# Patient Record
Sex: Male | Born: 1950 | Race: White | Hispanic: No | Marital: Married | Smoking: Never smoker
Health system: Southern US, Community
[De-identification: ages and names within clinical notes are randomized; demographics above are authoritative.]

## PROBLEM LIST (undated history)

## (undated) DIAGNOSIS — S42001A Fracture of unspecified part of right clavicle, initial encounter for closed fracture: Secondary | ICD-10-CM

## (undated) DIAGNOSIS — Z9861 Coronary angioplasty status: Secondary | ICD-10-CM

## (undated) DIAGNOSIS — R7303 Prediabetes: Secondary | ICD-10-CM

## (undated) DIAGNOSIS — E785 Hyperlipidemia, unspecified: Secondary | ICD-10-CM

## (undated) DIAGNOSIS — K219 Gastro-esophageal reflux disease without esophagitis: Secondary | ICD-10-CM

## (undated) DIAGNOSIS — M1990 Unspecified osteoarthritis, unspecified site: Secondary | ICD-10-CM

## (undated) DIAGNOSIS — R569 Unspecified convulsions: Secondary | ICD-10-CM

## (undated) DIAGNOSIS — Z8679 Personal history of other diseases of the circulatory system: Secondary | ICD-10-CM

## (undated) DIAGNOSIS — E119 Type 2 diabetes mellitus without complications: Secondary | ICD-10-CM

## (undated) DIAGNOSIS — I10 Essential (primary) hypertension: Secondary | ICD-10-CM

## (undated) DIAGNOSIS — I2510 Atherosclerotic heart disease of native coronary artery without angina pectoris: Secondary | ICD-10-CM

## (undated) DIAGNOSIS — Z87898 Personal history of other specified conditions: Secondary | ICD-10-CM

## (undated) HISTORY — DX: Coronary angioplasty status: Z98.61

## (undated) HISTORY — DX: Prediabetes: R73.03

## (undated) HISTORY — DX: Personal history of other diseases of the circulatory system: Z86.79

## (undated) HISTORY — DX: Hyperlipidemia, unspecified: E78.5

## (undated) HISTORY — DX: Essential (primary) hypertension: I10

## (undated) HISTORY — PX: COLONOSCOPY W/ BIOPSIES AND POLYPECTOMY: SHX1376

## (undated) HISTORY — DX: Personal history of other specified conditions: Z87.898

## (undated) HISTORY — DX: Atherosclerotic heart disease of native coronary artery without angina pectoris: I25.10

---

## 2003-04-18 ENCOUNTER — Other Ambulatory Visit: Payer: Self-pay

## 2003-05-03 DIAGNOSIS — I2510 Atherosclerotic heart disease of native coronary artery without angina pectoris: Secondary | ICD-10-CM

## 2003-05-03 DIAGNOSIS — Z9861 Coronary angioplasty status: Secondary | ICD-10-CM

## 2003-05-03 HISTORY — DX: Atherosclerotic heart disease of native coronary artery without angina pectoris: I25.10

## 2003-05-03 HISTORY — DX: Coronary angioplasty status: Z98.61

## 2003-05-03 HISTORY — PX: HERNIA REPAIR: SHX51

## 2003-05-03 HISTORY — PX: CARDIAC CATHETERIZATION: SHX172

## 2003-11-14 ENCOUNTER — Observation Stay (HOSPITAL_COMMUNITY): Admission: EM | Admit: 2003-11-14 | Discharge: 2003-11-14 | Payer: Self-pay | Admitting: Podiatry

## 2004-12-07 ENCOUNTER — Other Ambulatory Visit: Payer: Self-pay

## 2004-12-07 ENCOUNTER — Emergency Department: Payer: Self-pay | Admitting: Emergency Medicine

## 2008-08-06 ENCOUNTER — Emergency Department: Payer: Self-pay | Admitting: Emergency Medicine

## 2009-07-13 ENCOUNTER — Emergency Department: Payer: Self-pay | Admitting: Emergency Medicine

## 2010-02-12 ENCOUNTER — Emergency Department (HOSPITAL_COMMUNITY): Admission: EM | Admit: 2010-02-12 | Discharge: 2010-02-12 | Payer: Self-pay | Admitting: Emergency Medicine

## 2010-02-24 ENCOUNTER — Encounter: Admission: RE | Admit: 2010-02-24 | Discharge: 2010-02-24 | Payer: Self-pay | Admitting: Gastroenterology

## 2010-06-28 ENCOUNTER — Emergency Department (HOSPITAL_COMMUNITY): Payer: BC Managed Care – PPO

## 2010-06-28 ENCOUNTER — Observation Stay (HOSPITAL_COMMUNITY)
Admission: EM | Admit: 2010-06-28 | Discharge: 2010-06-30 | Disposition: A | Discharge status: Home / Self Care | Payer: BC Managed Care – PPO | Attending: Cardiovascular Disease | Admitting: Cardiovascular Disease

## 2010-06-28 DIAGNOSIS — I2510 Atherosclerotic heart disease of native coronary artery without angina pectoris: Principal | ICD-10-CM | POA: Insufficient documentation

## 2010-06-28 DIAGNOSIS — E785 Hyperlipidemia, unspecified: Secondary | ICD-10-CM | POA: Insufficient documentation

## 2010-06-28 DIAGNOSIS — I200 Unstable angina: Secondary | ICD-10-CM | POA: Insufficient documentation

## 2010-06-28 DIAGNOSIS — R0989 Other specified symptoms and signs involving the circulatory and respiratory systems: Secondary | ICD-10-CM | POA: Insufficient documentation

## 2010-06-28 DIAGNOSIS — R0609 Other forms of dyspnea: Secondary | ICD-10-CM | POA: Insufficient documentation

## 2010-06-28 DIAGNOSIS — Z01812 Encounter for preprocedural laboratory examination: Secondary | ICD-10-CM | POA: Insufficient documentation

## 2010-06-28 LAB — COMPREHENSIVE METABOLIC PANEL
AST: 27 U/L (ref 0–37)
Albumin: 4 g/dL (ref 3.5–5.2)
Alkaline Phosphatase: 75 U/L (ref 39–117)
BUN: 16 mg/dL (ref 6–23)
CO2: 25 mEq/L (ref 19–32)
Calcium: 9.3 mg/dL (ref 8.4–10.5)
Chloride: 107 mEq/L (ref 96–112)
Creatinine, Ser: 1.21 mg/dL (ref 0.4–1.5)
GFR calc Af Amer: >60 mL/min (ref >60)
Glucose, Bld: 180 mg/dL — ABNORMAL HIGH (ref 70–99)
Potassium: 4.1 mEq/L (ref 3.5–5.1)
Sodium: 139 mEq/L (ref 135–145)
Total Bilirubin: 0.7 mg/dL (ref 0.3–1.2)
Total Protein: 6.8 g/dL (ref 6.0–8.3)

## 2010-06-28 LAB — CBC
HCT: 41.5 % (ref 39.0–52.0)
MCH: 28.4 pg (ref 26.0–34.0)
MCHC: 34.2 g/dL (ref 30.0–36.0)
Platelets: 229 10*3/uL (ref 150–400)
RBC: 5 MIL/uL (ref 4.22–5.81)
RDW: 12.2 % (ref 11.5–15.5)
WBC: 5.6 10*3/uL (ref 4.0–10.5)

## 2010-06-28 LAB — POCT CARDIAC MARKERS
CKMB, poc: <1 ng/mL — ABNORMAL LOW (ref 1.0–8.0)
CKMB, poc: 1.3 ng/mL (ref 1.0–8.0)
Myoglobin, poc: 53.9 ng/mL (ref 12–200)
Troponin i, poc: <0.05 ng/mL (ref 0.00–0.09)
Troponin i, poc: <0.05 ng/mL (ref 0.00–0.09)

## 2010-06-28 LAB — DIFFERENTIAL
Basophils Absolute: 0 10*3/uL (ref 0.0–0.1)
Basophils Relative: 1 % (ref 0–1)
Eosinophils Absolute: 0.2 10*3/uL (ref 0.0–0.7)
Eosinophils Relative: 3 % (ref 0–5)
Lymphocytes Relative: 30 % (ref 12–46)
Lymphs Abs: 1.7 10*3/uL (ref 0.7–4.0)
Monocytes Absolute: 0.5 10*3/uL (ref 0.1–1.0)
Neutro Abs: 3.2 10*3/uL (ref 1.7–7.7)
Neutrophils Relative %: 57 % (ref 43–77)

## 2010-06-29 ENCOUNTER — Observation Stay (HOSPITAL_COMMUNITY): Payer: BC Managed Care – PPO

## 2010-06-29 HISTORY — PX: CARDIAC CATHETERIZATION: SHX172

## 2010-06-29 LAB — CARDIAC PANEL(CRET KIN+CKTOT+MB+TROPI)
CK, MB: 1.2 ng/mL (ref 0.3–4.0)
CK, MB: 1 ng/mL (ref 0.3–4.0)
Relative Index: INVALID (ref 0.0–2.5)
Relative Index: INVALID (ref 0.0–2.5)
Total CK: 77 U/L (ref 7–232)
Total CK: 66 U/L (ref 7–232)
Troponin I: <0.01 ng/mL (ref 0.00–0.06)
Troponin I: <0.01 ng/mL (ref 0.00–0.06)

## 2010-06-29 LAB — CBC
HCT: 39.4 % (ref 39.0–52.0)
Hemoglobin: 13.3 g/dL (ref 13.0–17.0)
MCHC: 33.8 g/dL (ref 30.0–36.0)
MCV: 82.1 fL (ref 78.0–100.0)
RBC: 4.8 MIL/uL (ref 4.22–5.81)
RDW: 12.2 % (ref 11.5–15.5)
WBC: 4.9 10*3/uL (ref 4.0–10.5)

## 2010-06-29 LAB — LIPID PANEL
Cholesterol: 132 mg/dL (ref 0–200)
HDL: 36 mg/dL — ABNORMAL LOW (ref >39)
LDL Cholesterol: 83 mg/dL (ref 0–99)
Total CHOL/HDL Ratio: 3.7 RATIO
VLDL: 13 mg/dL (ref 0–40)

## 2010-06-29 LAB — TSH: TSH: 1.943 u[IU]/mL (ref 0.350–4.500)

## 2010-06-29 LAB — APTT: aPTT: 28 seconds (ref 24–37)

## 2010-06-29 LAB — CK TOTAL AND CKMB (NOT AT ARMC)
CK, MB: 1.7 ng/mL (ref 0.3–4.0)
Total CK: 106 U/L (ref 7–232)

## 2010-06-29 LAB — HEPARIN LEVEL (UNFRACTIONATED): Heparin Unfractionated: 0.32 IU/mL (ref 0.30–0.70)

## 2010-06-29 LAB — PROTIME-INR
INR: 0.99 (ref 0.00–1.49)
Prothrombin Time: 13.3 seconds (ref 11.6–15.2)

## 2010-06-29 LAB — TROPONIN I: Troponin I: 0.01 ng/mL (ref 0.00–0.06)

## 2010-06-29 LAB — HEMOGLOBIN A1C: Mean Plasma Glucose: 148 mg/dL — ABNORMAL HIGH (ref <117)

## 2010-06-30 LAB — BASIC METABOLIC PANEL
Calcium: 8.8 mg/dL (ref 8.4–10.5)
Chloride: 106 mEq/L (ref 96–112)
Creatinine, Ser: 1.11 mg/dL (ref 0.4–1.5)
GFR calc Af Amer: >60 mL/min (ref >60)
GFR calc non Af Amer: >60 mL/min (ref >60)
Glucose, Bld: 127 mg/dL — ABNORMAL HIGH (ref 70–99)
Potassium: 4.1 mEq/L (ref 3.5–5.1)
Sodium: 138 mEq/L (ref 135–145)

## 2010-06-30 LAB — CBC
HCT: 38.3 % — ABNORMAL LOW (ref 39.0–52.0)
Hemoglobin: 12.8 g/dL — ABNORMAL LOW (ref 13.0–17.0)
MCH: 27.5 pg (ref 26.0–34.0)
MCHC: 33.4 g/dL (ref 30.0–36.0)
Platelets: 200 10*3/uL (ref 150–400)
RBC: 4.65 MIL/uL (ref 4.22–5.81)
RDW: 12.3 % (ref 11.5–15.5)
WBC: 6.9 10*3/uL (ref 4.0–10.5)

## 2010-06-30 LAB — POCT ACTIVATED CLOTTING TIME: Activated Clotting Time: 417 seconds

## 2010-07-01 ENCOUNTER — Emergency Department (HOSPITAL_COMMUNITY): Payer: BC Managed Care – PPO

## 2010-07-01 ENCOUNTER — Observation Stay (HOSPITAL_COMMUNITY)
Admission: EM | Admit: 2010-07-01 | Discharge: 2010-07-02 | DRG: 183 | Disposition: A | Discharge status: Home / Self Care | Payer: BC Managed Care – PPO | Attending: Cardiovascular Disease | Admitting: Cardiovascular Disease

## 2010-07-01 DIAGNOSIS — E119 Type 2 diabetes mellitus without complications: Secondary | ICD-10-CM | POA: Insufficient documentation

## 2010-07-01 DIAGNOSIS — Z9861 Coronary angioplasty status: Secondary | ICD-10-CM | POA: Insufficient documentation

## 2010-07-01 DIAGNOSIS — Z01810 Encounter for preprocedural cardiovascular examination: Secondary | ICD-10-CM | POA: Insufficient documentation

## 2010-07-01 DIAGNOSIS — R0789 Other chest pain: Secondary | ICD-10-CM | POA: Insufficient documentation

## 2010-07-01 DIAGNOSIS — Z01818 Encounter for other preprocedural examination: Secondary | ICD-10-CM | POA: Insufficient documentation

## 2010-07-01 DIAGNOSIS — Z8249 Family history of ischemic heart disease and other diseases of the circulatory system: Secondary | ICD-10-CM | POA: Insufficient documentation

## 2010-07-01 DIAGNOSIS — Z01812 Encounter for preprocedural laboratory examination: Secondary | ICD-10-CM | POA: Insufficient documentation

## 2010-07-01 DIAGNOSIS — K219 Gastro-esophageal reflux disease without esophagitis: Principal | ICD-10-CM | POA: Insufficient documentation

## 2010-07-01 DIAGNOSIS — I2510 Atherosclerotic heart disease of native coronary artery without angina pectoris: Secondary | ICD-10-CM | POA: Insufficient documentation

## 2010-07-01 LAB — POCT I-STAT, CHEM 8
Creatinine, Ser: 1.1 mg/dL (ref 0.4–1.5)
HCT: 45 % (ref 39.0–52.0)
Hemoglobin: 15.3 g/dL (ref 13.0–17.0)
Potassium: 4.6 mEq/L (ref 3.5–5.1)
Sodium: 139 mEq/L (ref 135–145)

## 2010-07-01 LAB — COMPREHENSIVE METABOLIC PANEL
BUN: 16 mg/dL (ref 6–23)
Calcium: 9.6 mg/dL (ref 8.4–10.5)
GFR calc non Af Amer: >60 mL/min (ref >60)
Glucose, Bld: 120 mg/dL — ABNORMAL HIGH (ref 70–99)
Total Protein: 7.9 g/dL (ref 6.0–8.3)

## 2010-07-01 LAB — POCT CARDIAC MARKERS
CKMB, poc: <1 ng/mL — ABNORMAL LOW (ref 1.0–8.0)
Myoglobin, poc: 67.5 ng/mL (ref 12–200)

## 2010-07-01 LAB — CBC
HCT: 42.7 % (ref 39.0–52.0)
Hemoglobin: 14.7 g/dL (ref 13.0–17.0)
RBC: 5.23 MIL/uL (ref 4.22–5.81)
WBC: 8.2 10*3/uL (ref 4.0–10.5)

## 2010-07-01 LAB — CK TOTAL AND CKMB (NOT AT ARMC)
Relative Index: INVALID (ref 0.0–2.5)
Total CK: 83 U/L (ref 7–232)

## 2010-07-01 LAB — APTT: aPTT: 27 seconds (ref 24–37)

## 2010-07-01 LAB — DIFFERENTIAL
Basophils Absolute: 0 10*3/uL (ref 0.0–0.1)
Lymphocytes Relative: 18 % (ref 12–46)
Monocytes Absolute: 1 10*3/uL (ref 0.1–1.0)
Neutro Abs: 5.5 10*3/uL (ref 1.7–7.7)

## 2010-07-01 LAB — URINALYSIS, ROUTINE W REFLEX MICROSCOPIC
Bilirubin Urine: NEGATIVE
Hgb urine dipstick: NEGATIVE
Protein, ur: NEGATIVE mg/dL
Specific Gravity, Urine: 1.021 (ref 1.005–1.030)
Urine Glucose, Fasting: NEGATIVE mg/dL
Urobilinogen, UA: 0.2 mg/dL (ref 0.0–1.0)

## 2010-07-01 LAB — AMYLASE: Amylase: 41 U/L (ref 0–105)

## 2010-07-01 LAB — TROPONIN I: Troponin I: 0.01 ng/mL (ref 0.00–0.06)

## 2010-07-01 LAB — PROTIME-INR: INR: 0.93 (ref 0.00–1.49)

## 2010-07-01 NOTE — Procedures (Signed)
[name], [name]              ACCOUNT NO.:  [id number]  MEDICAL RECORD NO.:  [id number]           PATIENT TYPE:  I  LOCATION:  6529                         FACILITY:  [facility]  PHYSICIAN:  Thereasa Solo. Little, M.D. DATE OF BIRTH:  15-Jul-1950  DATE OF PROCEDURE:  06/29/2010 DATE OF DISCHARGE:                           CARDIAC CATHETERIZATION   INDICATIONS FOR TEST:  This 60 year old male has had exertional anginal symptoms for several weeks and has had indigestion/heartburn for months. He is also noticed increasing dyspnea on exertion, presented last night with worsening anginal complaints.  His cardiac markers are negative.  After obtaining informed consent, the patient was prepped and draped in the usual sterile fashion exposing the right groin following local anesthetic with 1% Xylocaine.  The Seldinger technique was employed and a 5-French introducer sheath was placed in the right femoral artery. Left and right coronary arteriography and ventriculography in the RAO projection was performed.  Following this, PCI to the LAD was performed.  COMPLICATIONS:  None.  TOTAL CONTRAST USED:  210 mL.  PROCEDURES: 1. Left heart catheterization. 2. Ventriculography RAO projection. 3. PCI to mid LAD (see lesion).  RESULTS: 1. Hemodynamic monitoring.  Central aortic pressure was 106/66.  Left     ventricular pressure was 106/4.  There was no valve gradient noted     at the time of pullback. 2. Ventriculography.  Ventriculography in the RAO projection revealed     normal LV systolic function.  The ejection fraction was in excess     of 60%.  The end-diastolic pressure was 10. 3. Coronary arteriography.     a.     Left main was dilated at about 5 mm. 4. Circumflex.  The circumflex was appropriate size with 30%     irregularities in its midportion. 5. Ramus.  This was a smaller vessel about 1-1/2 mm free of disease. 6. LAD.  The proximal-third of the LAD was saccular and dilated just    distal to the diagonal and just distal to the saccular area in the     LAD with a concentric 95% area of narrowing.  It extended from the     end of the saccular area about 8 mm.  The more distal and the     distal mid LAD were free of disease and the diagonal which was a     large vessel that came off the saccular area was free of disease     and bifurcated. 7. Right coronary artery.  The right coronary artery was a large     slightly dilated 6-mm vessel with minimal proximal and mid     irregularities.  The PDA and posterior lateral vessels x2 were free     of disease.  Because of the high-grade lesion in the LAD, decision was made to intervene, it was not a discrete landing target in the proximal portion of the lesion and the stent had to extend right up into the dilated saccular area.  An XBLAD guide catheter, short Luge wire was used.  The Luge wire was placed down the LAD.  Predilatation was accomplished with a 2.5  x 12 apex balloon, two inflations, 7 x 30 and 14 x 30.  Stenting was accomplished with a Prmous (drug-eluting) 3.0 x 12 stent, 14 atmospheres x34 and 15 atmospheres x38 seconds.  Postdilatation was accomplished with a 3.25 x 8 TREK balloon 14 atmospheres for 35 seconds.  The area that had been 90% narrowed, pre-intervention now appeared to be normal.  There was no evidence of any dissection or distal embolization or thrombus formation.  The diagonal was not jailed by the stent and the stent did not extend a millimeter into the saccular area.  The patient should be ready for discharge to home, Angiomax was used with an ACT of 417.  There was given 60 mg of oral Effient.  He will be discharged on Effient and aspirin and the Effient will need to be continued for a minimum of 12 months.          ______________________________ Thereasa Solo. Little, M.D.     ABL/MEDQ  D:  06/29/2010  T:  06/30/2010  Job:  [id number]  cc:   Dr. Tresa Endo  Electronically Signed by  Julieanne Manson M.D. on 07/01/2010 07:58:07 AM

## 2010-07-02 LAB — CBC
MCH: 28.1 pg (ref 26.0–34.0)
MCHC: 33.9 g/dL (ref 30.0–36.0)
MCV: 82.7 fL (ref 78.0–100.0)
Platelets: 197 10*3/uL (ref 150–400)
RDW: 12.3 % (ref 11.5–15.5)
WBC: 5.2 10*3/uL (ref 4.0–10.5)

## 2010-07-02 LAB — GLUCOSE, CAPILLARY
Glucose-Capillary: 104 mg/dL — ABNORMAL HIGH (ref 70–99)
Glucose-Capillary: 108 mg/dL — ABNORMAL HIGH (ref 70–99)

## 2010-07-02 LAB — CARDIAC PANEL(CRET KIN+CKTOT+MB+TROPI)
CK, MB: 0.9 ng/mL (ref 0.3–4.0)
CK, MB: 0.9 ng/mL (ref 0.3–4.0)
Relative Index: INVALID (ref 0.0–2.5)
Relative Index: INVALID (ref 0.0–2.5)
Total CK: 52 U/L (ref 7–232)
Troponin I: 0.01 ng/mL (ref 0.00–0.06)

## 2010-07-02 LAB — BASIC METABOLIC PANEL
BUN: 13 mg/dL (ref 6–23)
CO2: 23 mEq/L (ref 19–32)
Calcium: 9 mg/dL (ref 8.4–10.5)
Chloride: 106 mEq/L (ref 96–112)
Creatinine, Ser: 1.02 mg/dL (ref 0.4–1.5)
GFR calc Af Amer: >60 mL/min (ref >60)
Glucose, Bld: 126 mg/dL — ABNORMAL HIGH (ref 70–99)

## 2010-07-02 LAB — HEPARIN LEVEL (UNFRACTIONATED): Heparin Unfractionated: 0.37 IU/mL (ref 0.30–0.70)

## 2010-07-02 NOTE — Discharge Summary (Signed)
[name], [name]              ACCOUNT NO.:  [id number]  MEDICAL RECORD NO.:  [id number]           PATIENT TYPE:  I  LOCATION:  6529                         FACILITY:  [facility]  PHYSICIAN:  Thurmon Fair, MD     DATE OF BIRTH:  02/15/1951  DATE OF ADMISSION:  06/28/2010 DATE OF DISCHARGE:  06/30/2010                              DISCHARGE SUMMARY   DISCHARGE DIAGNOSES: 1. Unstable angina.  Negative myocardial infarction. 2. Coronary artery disease with 90% left anterior descending artery     stenosis, undergoing percutaneous transluminal coronary angioplasty     and stent deployment with a Promus drug-eluting stent to the left     anterior descending artery. 3. Normal left ventricular function. 4. Elevated hemoglobin A1c.     a.     Hyperglycemia with diabetic teaching during hospitalization. 5. Dyslipidemia, treated.  DISCHARGE CONDITION:  Improved.  PROCEDURES:  Combined left heart cath by Dr. Julieanne Manson on June 29, 2010.  On June 29, 2010, PTCA and stent deployment to the LAD with a drug- eluting Promus stent by Dr. Julieanne Manson.  HISTORY OF PRESENT ILLNESS:  A 60 year old male with a history of coronary artery disease with 50% circumflex and LAD aneurysm in 2005 and has been treated.  Over the last 5 months, he has been having increasing dyspnea on exertion and then 3 days prior to admission, had been having chest tightness and left arm pain with exercise and takes less exercise to cause the pain to come on.  He came to the emergency room on June 28, 2010.  Enzymes are negative.  EKG without change with episodic chest tightness and left arm and left neck pain.  No associated nausea, vomiting, or diaphoresis.  He was admitted to rule out MI and plans for cardiac catheterization with history of known coronary artery disease and concern that the LAD disease had increased, and he was placed on IV heparin, nitro paste.  PAST MEDICAL HISTORY:  Other than  coronary artery disease, has gastroesophageal reflux disease followed by Dr. Matthias Hughs, previous hernia repair, history of SVT.  DISCHARGE MEDICATIONS:  See medication reconciliation sheet from [facility]. We did add Effient and aspirin for his stent as well as ramipril for his hypertension.  We did not add any diabetic medicine at this time.  We have asked him to follow up with his primary care within the next week for further management.  DISCHARGE INSTRUCTIONS: 1. May return to work Monday, on July 05, 2010. 2. Increase activity slowly.  May shower.  No lifting for 1 week.  No     driving for 2 days.  No sexual activity for 2 days. 3. Low-sodium heart-healthy diabetic diet. 4. Wash cath site with soap and water.  Call if any bleeding, swelling     or drainage. 5. Follow up with Dr. Herbie Baltimore at [facility] on     July 12, 2010 at 11:30 a.m. 6. Follow up with [facility] in [place] within 1 week.  LABORATORY DATA:  Sodium 138, potassium 4.1, chloride 106, CO2 25, glucose 127, BUN 11, creatinine 1.11, calcium 8.8.  Hemoglobin  12.8, hematocrit 38.3, WBC 6.9, platelets 200.  Cardiac enzymes were all negative with CK 66, MB 1.0, troponin-I less than 0.01.  TSH was 1.943.  Hemoglobin A1c was 6.8.  Total cholesterol 132, triglycerides 63, HDL 36 and LDL 83.  PTT on admission was 28 with a protime of 13.3, INR of 0.99 and a BNP of less than 30.  Chest x-ray; no active cardiopulmonary disease.  RADIOLOGY:  On admission, sinus rhythm without acute changes and on the morning of June 30, 2010, sinus rhythm and no acute changes.  The patient was seen by Dr. Lynnea Ferrier and felt ready for discharge.  We will have him talk to diabetic teaching and arrange outpatient diabetic teaching for the patient and then he will be discharged home.  He was ambulated with cardiac rehab without complications.     [name]. [name],  N.P.   ______________________________ Thurmon Fair, MD    LRI/MEDQ  D:  06/30/2010  T:  06/30/2010  Job:  [id number]  cc:   Landry Corporal, MD [facility]  Electronically Signed by Nada Boozer N.P. on 07/01/2010 08:25:12 AM Electronically Signed by Thurmon Fair M.D. on 07/02/2010 12:51:34 PM

## 2010-07-02 NOTE — H&P (Signed)
[name], [name]              ACCOUNT NO.:  [id number]  MEDICAL RECORD NO.:  [id number]           PATIENT TYPE:  E  LOCATION:  [facility]                         FACILITY:  [facility]  PHYSICIAN:  [name] Fair, MD     DATE OF BIRTH:  1951-01-09  DATE OF ADMISSION:  06/28/2010 DATE OF DISCHARGE:                             HISTORY & PHYSICAL   CHIEF COMPLAINT:  Chest pain.  HISTORY OF PRESENT ILLNESS:  [name] is a 60 year old male was followed in our office with a history of coronary artery disease.  He had a catheterization in 2005 that showed 50% circumflex and a proximal LAD aneurysm.  He has had subsequent stress test, which have been normal.  He thinks his last stress test was about 12-18 months ago.  He says he has been doing well until about the last 5 months when he noted increasing dyspnea on exertion.  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  Last 3 days, he has had some chest tightness and some left arm pain with less exertion.  His wife encouraged him to come to the emergency room and he seen tonight.  His initial enzymes are negative and his EKG is without acute changes.  He does say he gets tightness in his chest that radiates to his left arm and his left neck.  This was associated with exertion and also shortness of breath.  He denies any associated nausea and vomiting or diaphoresis.  PAST MEDICAL HISTORY:  Remarkable for gastroesophageal reflux, this was followed by Dr. Matthias [name].  He had a barium swallow in October.  He has had previous hernia repair.  He has a history of SVT and has been treated in the past with adenosine, he says the last time he had this was about a year ago.  HOME MEDICATIONS: 1. Metoprolol 25 mg a day. 2. Protonix 40 mg a day. 3. Aspirin 81 mg a day.  He has no known drug allergies.  SOCIAL HISTORY:  He is married, he works a farm.  He is a nonsmoker and nondrinker.  FAMILY HISTORY:  Remarkable for coronary  artery disease, his brother had a heart attack in his 86s and his father had a heart attack in his 22s.  REVIEW OF SYSTEMS:  Essentially unremarkable except for noted above, he denies any GI bleeding or melena.  PHYSICAL EXAMINATION:  VITAL SIGNS:  Blood pressure is 123/77, pulse 77, temperature 98.7. GENERAL:  He is a well-developed and well-nourished male in no acute distress. HEENT:  Normocephalic.  Extraocular movements are intact.  Sclerae are nonicteric.  Lids and conjunctivae are within normal limits. NECK:  Without JVD or bruit. CHEST:  Clear to auscultation and percussion. CARDIAC:  Reveals regular rate and rhythm without murmur, rub, or gallop.  Normal S1-S2. ABDOMEN:  Nontender, nondistended. EXTREMITIES:  Without edema.  Distal pulses are 2+/4 bilaterally.  There is no edema. NEUROLOGIC:  Grossly intact.  He is awake, alert, oriented, and cooperative.  Moves all extremities without obvious deficit. SKIN:  Cool and dry.  EKG shows sinus rhythm without acute changes.  LABORATORY  DATA:  White count 5.6, hemoglobin 14.2, hematocrit 41.5, platelets 229.  Sodium 139, potassium 4.1, BUN 16, creatinine 1.21.  His troponins are negative x2.  Chest x-ray is pending.  IMPRESSION: 1. Unstable angina. 2. Known coronary artery disease by catheterization in 2005 with a 50%     circumflex and aneurysmal dilatation of the proximal left anterior     descending artery. 3. Family history of coronary artery disease. 4. Gastroesophageal reflux.  PLAN:  The patient will be admitted to telemetry.  We will continue his beta-blocker and aspirin and start heparin and he is currently on nitropaste.  He will most likely need diagnostic catheterization and this will be set up for tomorrow.     [name], P.A.   ______________________________ [name] Fair, MD    LKK/MEDQ  D:  06/29/2010  T:  06/29/2010  Job:  [id number]  Electronically Signed by Corine Shelter P.A. on 07/01/2010  09:33:30 AM Electronically Signed by [name] M.D. on 07/02/2010 12:51:29 PM

## 2010-07-05 LAB — POCT ACTIVATED CLOTTING TIME: Activated Clotting Time: 128 seconds

## 2010-07-09 NOTE — Procedures (Signed)
  NAMEJONATHYN, [name]              ACCOUNT NO.:  [id number]  MEDICAL RECORD NO.:  [id number]           PATIENT TYPE:  I  LOCATION:  2505                         FACILITY:  [facility]  PHYSICIAN:  Thereasa Solo. Garrell Flagg, M.D. DATE OF BIRTH:  1951-01-20  DATE OF PROCEDURE:  07/02/2010 DATE OF DISCHARGE:  07/02/2010                           CARDIAC CATHETERIZATION   SURGEON:  Thereasa Solo. Bartholomew Ramesh, MD  INDICATIONS FOR TEST:  This 60 year old male was just discharged from the hospital on July 29, 2010, after having had a complicated stenting to his mid LAD on June 29, 2010.  It appeared to be an excellent result.  He had atypical chest pain on the afternoon of July 01, 2010, and was admitted with atypical chest pain.  His EKG was normal and his CK and troponin were negative.  He is brought back to the Cath Lab for relook at his LAD.  After obtaining informed consent, the patient was prepped and draped in the usual sterile fashion exposing the right groin.  Following local anesthetic with 1% Xylocaine, the Seldinger technique was employed and a 5-French introducer sheath was placed in the right femoral artery evaluation.  Evaluation of the left coronary system was performed.  COMPLICATIONS:  None.  TOTAL CONTRAST USED:  15 mL.  Central aortic pressure 132/80.  RESULTS:  LAD:  The proximal third of the LAD was dilated and aneurysmal and this is unchanged from his prior cath.  The stent is in the midportion of the LAD that extends from the end of the dilated area down the LAD, is widely patent with brisk TIMI 3 flow.  The diagonal is widely patent.  The circumflex which gives rise to large OM vessel and an ongoing branch off the distal circ is widely patent and the ramus intermediate was free of significant disease.  The right coronary artery had no significant disease on June 29, 2010.  I see nothing angiographically to explain his symptoms.  The stent is widely patent.  He  will be ready for discharge later today.  He will still need to continue his outpatient cardiac meds.          ______________________________ Thereasa Solo Maleeah Crossman, M.D.     ABL/MEDQ  D:  07/02/2010  T:  07/03/2010  Job:  [id number]  cc:   Georga Hacking, M.D.  Electronically Signed by Julieanne Manson M.D. on 07/09/2010 02:26:26 PM

## 2010-07-14 LAB — COMPREHENSIVE METABOLIC PANEL
ALT: 35 U/L (ref 0–53)
Alkaline Phosphatase: 64 U/L (ref 39–117)
BUN: 13 mg/dL (ref 6–23)
CO2: 25 mEq/L (ref 19–32)
Chloride: 106 mEq/L (ref 96–112)
Glucose, Bld: 204 mg/dL — ABNORMAL HIGH (ref 70–99)
Potassium: 3.5 mEq/L (ref 3.5–5.1)
Sodium: 138 mEq/L (ref 135–145)
Total Bilirubin: 0.7 mg/dL (ref 0.3–1.2)
Total Protein: 6.6 g/dL (ref 6.0–8.3)

## 2010-07-14 LAB — URINALYSIS, ROUTINE W REFLEX MICROSCOPIC
Glucose, UA: 500 mg/dL — AB
Hgb urine dipstick: NEGATIVE
Protein, ur: NEGATIVE mg/dL
Specific Gravity, Urine: 1.013 (ref 1.005–1.030)
pH: 6.5 (ref 5.0–8.0)

## 2010-07-14 LAB — POCT CARDIAC MARKERS: Myoglobin, poc: 54 ng/mL (ref 12–200)

## 2010-07-14 LAB — CBC
HCT: 42.6 % (ref 39.0–52.0)
Hemoglobin: 14.9 g/dL (ref 13.0–17.0)
MCV: 85.4 fL (ref 78.0–100.0)
RBC: 4.99 MIL/uL (ref 4.22–5.81)
WBC: 5.9 10*3/uL (ref 4.0–10.5)

## 2010-07-14 LAB — DIFFERENTIAL
Basophils Absolute: 0 10*3/uL (ref 0.0–0.1)
Basophils Relative: 1 % (ref 0–1)
Eosinophils Absolute: 0.3 10*3/uL (ref 0.0–0.7)
Monocytes Absolute: 0.8 10*3/uL (ref 0.1–1.0)
Monocytes Relative: 13 % — ABNORMAL HIGH (ref 3–12)
Neutro Abs: 2.9 10*3/uL (ref 1.7–7.7)
Neutrophils Relative %: 49 % (ref 43–77)

## 2010-07-14 NOTE — H&P (Signed)
[name], [name]              ACCOUNT NO.:  [id number]  MEDICAL RECORD NO.:  [id number]           PATIENT TYPE:  I  LOCATION:  2505                         FACILITY:  [facility]  PHYSICIAN:  Nanetta Batty, M.D.   DATE OF BIRTH:  Sep 02, 1950  DATE OF ADMISSION:  07/01/2010 DATE OF DISCHARGE:                             HISTORY & PHYSICAL   CHIEF COMPLAINT:  Chest pain.  HISTORY OF PRESENT ILLNESS:  A 60 year old white married male who was just discharged from [facility] on June 30, 2010, after presenting with unstable angina on the 27th undergoing cardiac cath and finding a 90% LAD stenosis.  Previously, the patient had known coronary artery disease with 50% circ and proximal LAD aneurysm.  The patient did fairly well, underwent complex LAD procedure, and did have chest pain the night after the procedure, but by yesterday June 30, 2010, he had no chest pain, ambulated without complaints, and was ready for discharge home. He was what sound to be a diabetic on that admission and was put on diabetic diet and was to follow up with his primary care for diabetes. After he got home, he had discomfort last night, it resolved on its own, but then today he had return of midsternal chest pain a little bit in his left arm similar to the pain that brought him to the hospital on the 27th.  Mild diaphoresis.  He took nitro and he did get relief.  He walked around a little bit and the pain returned, so he came to the emergency room after calling our office.  Here in the emergency room, he had mild chest discomfort and we placed him on IV nitro drip and IV heparin as well.  EKG is without acute changes.  Dr. Allyson Sabal has been here and has seen and evaluated him.  PAST MEDICAL HISTORY:  Coronary artery disease as stated, he also has gastroesophageal reflux disease followed by Dr. Matthias Hughs with a barium swallow in October, previous hernia repair.  Other history includes SVT that had been  treated in the past with adenosine and the last time he had any SVT was a year ago.  OUTPATIENT MEDICATIONS: 1. Tylenol p.r.n. 2. Nitroglycerin p.r.n. 3. Enteric-coated aspirin 325 mg daily. 4. Effient 10 mg p.o. daily. 5. Ramipril 5 mg one p.o. daily. 6. Clonazepam 0.5 mg half to one p.o. b.i.d. p.r.n. 7. Toprol-XL 25 mg p.o. q.a.m. 8. Protonix 40 mg one p.o. b.i.d. 9. Pravastatin 20 mg one p.o. at bedtime.  ALLERGIES:  No known allergies.  SOCIAL HISTORY:  Married.  He works in a farm.  Does not use tobacco or alcohol.  FAMILY HISTORY:  Positive for coronary artery disease.  His brother had a heart attack in his 54s and his father had a heart attack in his 26s.  REVIEW OF SYSTEMS:  Unremarkable.  All systems negative except for chest pain as stated.  PHYSICAL EXAMINATION:  VITAL SIGNS:  Blood pressure 111/77, pulse 87, respirations 16, temperature 97.5, oxygen saturation 99% on room air. GENERAL:  Alert and oriented white male in no acute distress, pleasant affect. SKIN:  Warm and dry.  Brisk capillary refill. HEENT:  Normocephalic.  Sclerae are clear. NECK:  Supple.  No JVD. HEART:  Regular rate and rhythm. LUNGS:  Clear, but somewhat decreased in the left base. ABDOMEN:  Positive bowel sounds.  Nontender. EXTREMITIES:  No edema. NEURO:  Alert and oriented x3.  Follows commands.  Moves all extremities.  Chest x-ray and labs are pending.  IMPRESSION: 1. Chest pain with recent placement of PROMUS LAD drug-eluting stent     on June 29, 2010.  Previously known coronary artery disease,     50% left circ and proximal LAD aneurysm, but found to have a LAD     90% stenosis on cath on June 29, 2010, and underwent PTCA and     stent with PROMUS drug-eluting stent.  Dr. Clarene Duke stated this was a     complex procedure. 2. New diabetes mellitus, diet controlled. 3. History of gastroesophageal reflux disease. 4. Family history of coronary artery disease.  PLAN:  Admit  with IV heparin, IV nitro, continue Effient, continue Protonix, which is b.i.d.  Serial CK MBs and cardiac catheterization on July 02, 2010.  Dr. Allyson Sabal has seen and evaluated him.     [name]. [name], N.P.   ______________________________ Nanetta Batty, M.D.    LRI/MEDQ  D:  07/01/2010  T:  07/02/2010  Job:  [id number]  cc:   [employer]  Electronically Signed by Nada Boozer N.P. on 07/02/2010 05:07:17 PM Electronically Signed by Nanetta Batty M.D. on 07/14/2010 01:58:24 PM

## 2010-07-14 NOTE — Discharge Summary (Signed)
[name], [name]              ACCOUNT NO.:  [id number]  MEDICAL RECORD NO.:  [id number]           PATIENT TYPE:  I  LOCATION:  2505                         FACILITY:  [facility]  PHYSICIAN:  Nanetta Batty, M.D.   DATE OF BIRTH:  October 25, 1950  DATE OF ADMISSION:  07/01/2010 DATE OF DISCHARGE:  07/02/2010                              DISCHARGE SUMMARY   DISCHARGE DIAGNOSES: 1. Chest pain secondary to gastroesophageal reflux disease. 2. Gastroesophageal reflux disease. 3. Coronary disease with percutaneous transluminal coronary     angioplasty and stent placement just prior to this admission on     July 28, 2010, due to left anterior descending stenosis, now has a     Promus drug-eluting stent to the left anterior descending. 4. New diabetes mellitus type 2, diet controlled.  Follow up with     primary. 5. Family history of coronary disease. 6. Stable coronary arteries with cardiac cath this admission.     a.     Negative myocardial infarction by enzymes.  HISTORY OF PRESENT ILLNESS:  A 60 year old white married male who was recently discharged from [facility] on June 30, 2010 and readmitted July 01, 2010 for recurrent chest pain.  He had received a stent to his LAD for 90% stenosis on June 29, 2010.  Developed chest pain after the procedure, which was treated with nitro and Maalox.  We are not sure which cleared the pain.  He was stable on the morning of the 29th.  He ambulated without problems, went home.  He had pain later that evening and then again the next morning on March 1, the pain kept returning and little discomfort in his left arm, was relieved with nitroglycerin.  He came to the emergency room.  We readmitted him and placed him on IV nitro and IV heparin.  EKG was without changes.  It was felt prudent to repeat his cardiac cath to ensure his stent was stable as it was a complex procedure.  During the night, he did develop more chest pain. It was treated only  with a GI cocktail and the pain subsided.  By morning rounds, his enzymes were negative.  EKG was normal and labs were stable.  He and his family were quite concerned about the recurrent pain.  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 He eats one main meal a day, which is at night and then promptly goes to bed after eating.  He was instructed to eat smaller meals several times during the day.  We did proceed with the cardiac cath, which found the stent to be patent and as he had no significant stenosis in his other vessels, so other vessels were not re-cath'd.  The patient ambulated well after procedure, was stable and ready for discharge home.  We have added Zantac at bedtime and have instructed him to eat small meals a day instead of one large meal and then sleeping.  It may benefit him also to raise the head of his bed on 6-inch blocks, which may help prevent any reflux.  DISCHARGE  INSTRUCTIONS: 1. May return to work, July 05, 2010.  No problems at the cath site.     Increase activity slowly.  May shower on July 03, 2010.  No lifting     for 3 days.  No driving for 2 days.  No sexual activity for 2 days.     Low-sodium, heart-healthy diabetic diet.  Eat small meals several     times a day instead of one large when at bedtime. 2. Wash cath site with soap and water.  Call if any bleeding,     swelling, or drainage. 3. Follow up with Dr. Herbie Baltimore at [facility], July 12, 2010, at     11:30 a.m. and follow up with Dr. Matthias Hughs the GI doctor for his     gastric issues.  DISCHARGE MEDICATIONS:  See medication reconciliation sheet, please note these medicines were exactly the same as it was discharged on the 29th. They are listed as new medications he was on them when he came into the hospital this admission, except we have added Zantac in addition to his Protonix twice a day.  LABORATORY DATA:  Cardiac enzymes were negative with CKs 49, MBs  0.8, troponin I 0.01.  Chemistry; sodium 139, potassium 4.1, chloride 106, CO2 23, glucose 126, BUN 13, creatinine 1.02, and calcium 9.0.  Protime 14.2, INR of 1.08. CBC; hemoglobin 13.1, hematocrit 38.6, platelets 197, WBC 5.2.  I did do amylase and lipase, which were within normal, lipase 39 and amylase 41.  UA is negative, he was spill of 15 ketones.  Chest x-ray:  No acute or significant findings on chest x-ray.  On March 1, EKG with sinus rhythm with no acute changes.  The patient will follow up as instructed.  On previous hospitalization, his hemoglobin A1c was 6.8.  He was given diabetic teaching on that hospitalization. Arrangements were made for outpatient diabetic teaching and he will follow up with his primary care for further management.     [name]. [name], N.P.   ______________________________ Nanetta Batty, M.D.    LRI/MEDQ  D:  07/02/2010  T:  07/03/2010  Job:  [id number]  cc:   Engineer, drilling Signed by Nada Boozer N.P. on 07/06/2010 05:47:27 PM Electronically Signed by Nanetta Batty M.D. on 07/14/2010 01:58:21 PM

## 2010-09-17 NOTE — Cardiovascular Report (Signed)
NAME:  [name], [name]                        ACCOUNT NO.:  [id number]   MEDICAL RECORD NO.:  [id number]                   PATIENT TYPE:  OBV   LOCATION:  6529                                 FACILITY:  [facility]   PHYSICIAN:  Darlin Priestly, M.D.             DATE OF BIRTH:  04-06-1951   DATE OF PROCEDURE:  11/14/2003  DATE OF DISCHARGE:                              CARDIAC CATHETERIZATION   PROCEDURES PERFORMED:  1. Left heart catheterization.  2. Coronary angiography.  3. Left ventriculogram.  4. Ascending aortography.   ATTENDING:  Darlin Priestly, M.D.   COMPLICATIONS:  None.   INDICATIONS:  Mr. Milliron is a 60 year old male with no significant past  medical history who has had two to three weeks of intermittent substernal  chest pain evaluated initially in [place].  He has had a stress test in  [place] which he reports was normal in December.  He continued to have  intermittent chest pain.  He ultimately presented to the ER on November 13, 2003  with recurrent pain with no significant EKG change.  Because of ongoing  symptoms he is now referred for cardiac catheterization to define his  coronary anatomy.   DESCRIPTION OF OPERATION:  After giving informed written consent, patient  brought to the cardiac catheterization laboratory.  Right and left groin  shaved, prepped and draped in usual sterile fashion.  ECG monitor  established.  Using a modified Seldinger technique, a number 6-French  arterial sheath inserted in right femoral artery.  A 6-French diagnostic  catheter was then used to perform diagnostic angiography.  This reveals a  large left main with no significant disease.  The LAD is a large vessel that  coursed to the apex into one diagonal branch.  The LAD is noted to be  aneurysmal in its proximal through early mid segment up to 6 mm in  dimension.  There is good flow throughout this area and into the apical LAD.  There is a 50% lesion in the mid LAD beyond  the takeoff of the large first  diagonal.  The remainder of the LAD is coursely irregular, but has no high  grade stenosis.   The first diagonal is a large vessel which bifurcates distally with no  significant disease.   The left coronary artery also gives rise to a medium-sized ramus intermedius  which bifurcates in the mid segment.  There is a 50% ostial lesion.   Left circumflex is a large vessel coursing the AV groove and gives rise to  two obtuse marginal branches.  The AV groove circumflex has no significant  disease.  First OM is a large vessel with 20% proximal narrowing.  The  second OM is a small vessel with no significant disease.   The right coronary artery is a large vessel which also appears to be mildly  aneurysmal, coursely irregular with a 30% distal stenosis.  The PDA and  posterolateral branch are large vessels with no significant disease.   Left ventriculogram reveals a preserved EF of 55%.  There appears to be a  mild inferior hypokinesis.   Ascending aortography reveals no evidence of aortic dissection or aneurysm.  There is mild aortic regurgitation.   HEMODYNAMICS:  Systemic arterial pressure 105/68, LV systemic pressure  105/4, LVEDP of 11.   CONCLUSION:  1. 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  2. Normal left ventricular systolic function with mild wall motion     abnormalities.  3. No evidence of aortic dissection with mild aortic regurgitation.                                               Darlin Priestly, M.D.    RHM/MEDQ  D:  11/14/2003  T:  11/14/2003  Job:  [id number]

## 2010-09-17 NOTE — H&P (Signed)
NAME:  [name], [name]                        ACCOUNT NO.:  [id number]   MEDICAL RECORD NO.:  [id number]                   PATIENT TYPE:  EMS   LOCATION:  MAJO                                 FACILITY:  [facility]   PHYSICIAN:  Quita Skye. Waldon Reining, MD             DATE OF BIRTH:  1950/05/12   DATE OF ADMISSION:  11/13/2003  DATE OF DISCHARGE:                                HISTORY & PHYSICAL   HISTORY OF PRESENT ILLNESS:  The patient is a 60 year old white man who is  admitted to [facility] for further evaluation of chest pain.  The  patient, who has no past history of cardiac disease, presented to the  emergency department with a two-week history of intermittent chest pain.  The chest pain is described as a pressure and ache across the anterior  chest.  It radiates down the left upper arm.  Episodes occur at random and  appear to be unrelated to position, activity, meals, or respiration.  There  were no exacerbating or ameliorating factors.  Episodes last approximately  one hour and resolve spontaneously; he experiences multiple episodes a day.  There is no associated dyspnea, diaphoresis, or nausea.   As noted, the patient has no past history of cardiac disease, including no  history of myocardial infarction, coronary artery disease, congestive heart  failure, or arrhythmia.  He has been seen for chest pain twice in the past,  including an overnight stay at [facility] last  December.  A stress test at that time was reportedly negative.   The patient has no history of hypertension, hyperlipidemia, diabetes  mellitus, or smoking.  There is a strong family history of early coronary  artery disease.  His brother died of a myocardial infarction in his 67's.  His father died of a myocardial infarction in his later 55's.   The patient does not take any medications, he is not allergic to anything.   PAST MEDICAL HISTORY:  Significant injuries; none.   PAST  SURGICAL HISTORY:  Herniorrhaphy.   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  He lives with his wife.  He neither smokes nor drinks.   FAMILY HISTORY:  Notable for coronary artery disease as described above.   REVIEW OF SYSTEMS:  No problems relating to his head, eyes, ears, nose,  mouth, throat, lungs, gastrointestinal systems, genitourinary system, or  extremities.  There is no history of neurologic or psychiatric disorder.  There is no history of fever, chills, or weight loss.   PHYSICAL EXAMINATION:  VITAL SIGNS:  Blood pressure 145/77, pulse 90 and  regular, respirations 20, temperature 98.4.  GENERAL:  The patient was a middle-aged white male in no discomfort.  He was  alert, oriented, appropriate, and responsive.  HEENT:  Head, eyes, nose, and mouth were normal.  NECK:  Without thyromegaly or adenopathy.  Carotid pulses were palpable  bilaterally without bruits.  HEART:  Normal S1 and S2. There was no S3, S4, murmur, rub, or click.  Cardiac rhythm was regular.  NO chest wall tenderness was noted.  LUNGS:  Clear.  ABDOMEN:  Soft and nontender.  There was no mass, hepatosplenomegaly, bruit,  distention, rebound, guarding, or rigidity.  Bowel sounds were normal.  RECTAL:  GENITOURINARY:  Not performed as they were not pertinent to the  reason for acute care hospitalization.  EXTREMITIES:  Without edema, deviation, or deformity.  Radial and dorsalis  pedal pulses were palpable bilaterally.  Brief screening neurologic survey  was unremarkable.   The electrocardiogram was normal.  The chest radiograph was normal.  All  blood work was pending at the time of this dictation.   IMPRESSION:  Chest pain, rule out coronary artery disease.   PLAN:  1. Telemetry.  2. Serial cardiac enzymes.  3. Aspirin.  4. Lovenox.  5. Intravenous nitroglycerin.  6. Fasting lipid profile.  7. Further measures per Madaline Savage, M.D.                                                 Quita Skye. Waldon Reining, MD    MSC/MEDQ  D:  11/14/2003  T:  11/14/2003  Job:  [id number]   cc:   Madaline Savage, M.D.  [address], [address]  [place]  [place] [address]  Fax: [phone]

## 2010-09-17 NOTE — Discharge Summary (Signed)
NAME:  [name], [name]                        ACCOUNT NO.:  [id number]   MEDICAL RECORD NO.:  [id number]                   PATIENT TYPE:  OBV   LOCATION:  6529                                 FACILITY:  [facility]   PHYSICIAN:  Madaline Savage, M.D.             DATE OF BIRTH:  18-Jun-1950   DATE OF ADMISSION:  11/13/2003  DATE OF DISCHARGE:  11/14/2003                                 DISCHARGE SUMMARY   ADMISSION DIAGNOSES:  1. Chest pain.  2. History of hernia repair.   DISCHARGE DIAGNOSES:  1. Chest pain.  2. History of hernia repair.  3. Status post cardiac catheterization, on November 14, 2003, by Dr. Jenne Campus.     This revealed no obstructive coronary artery disease; however, findings     were significant for aneurysmal dilatation in the left anterior     descending artery.  See the dictated report for details.  Ejection     fraction 55%.  There is no aortic dissection.   HISTORY OF PRESENT ILLNESS:  [name] is a pleasant 60 year old white  male who was admitted to [facility] for further evaluation of chest  pain, on November 13, 2003, by Dr. Lemmie Evens.  He has no past history of  cardiac disease.  He presented to the ER with a two week history of  intermittent chest pain.  The chest pain is described as a pressure and ache  across the anterior chest.  It radiates in the left arm.  Episodes occur at  random and do not appear to be related to position, activity, meals,  respiration.  There is no exacerbating or ameliorating factors.  The  episodes last approximately one hour and then resolve spontaneously.  He  experiences multiple episodes per day.  There is no associated dyspnea,  diaphoresis, or nausea.  It was noted that apparently he has been seen for  chest pain twice in the past including an overnight stay at [facility] last December.  At that time he had a stress test  which was reportedly negative.  On exam blood pressure is 145/77,  heart rate  90.  No other significant abnormalities on exam.  His EKG was normal.  Blood  work was pending.  At this point, he was seen and evaluated by Dr. Lemmie Evens.  He was planned for admission to telemetry, check serial enzymes,  rule out MI.  He would be placed on aspirin, Lovenox, IV nitroglycerin.  Recheck fasting lipid profile and he would await further measures per Dr.  Elsie Lincoln in the morning.  (Please note the patient already had an appointment  to see Dr. Elsie Lincoln for initial evaluation but had not yet made the  appointment).   HOSPITAL COURSE:  1. On the morning of November 14, 2003, [name] without further chest pain.     At this point he was stable and labs are negative.  At this point, he was     seen and evaluated by Dr. Lenise Herald.  It was felt that we needed to     proceed with definitive cardiac catheterization.  Risks and benefits of     the procedure were discussed with him including 1% risk of death, MI,     stroke, bleeding.  The patient was agreeable to proceed.  2. On November 14, 2003, [name] underwent cardiac cath by Dr. Lenise Herald.  See the dictated report for findings.  He was found to have     nonobstructive CAD.  He did have aneurysmal dilatation of the LAD.  EF is     55%.  At this time, Dr. Jenne Campus plans for medical therapy of his CAD.  He     would review the films with CVTS as far as the LAD aneurysm.  It was felt     that if the patient was stable after bedrest, that he could be discharged     home this afternoon with outpatient followup.   HOSPITAL CONSULTS:  None.   HOSPITAL PROCEDURES:  1. Cardiac catheterization, November 14, 2003, by Dr. Lenise Herald.  This     revealed nonobstructive CAD.  He did have some LAD aneurysmal dilatation.     Normal LV function.  See Dr. Mikey Bussing dictated report for details.  The     patient tolerated the procedure well with no complication.  2. EKG was normal sinus rhythm with no ST-T change.  It  was normal EKG     tracing.   LAB REVIEW:  Cardiac enzymes were negative.  White count 5.8, hemoglobin  14.4, hematocrit 40.8, platelets 230.  Sodium 138, potassium 3.7, BUN 15,  creatinine 1.0.   DISCHARGE MEDICATIONS:  1. Toprol XL 25 mg a day.  2. Nexium 40 mg once a day.  3. Aspirin 81 mg once a day.   No strenuous activity, lifting more than 5 pounds, no driving or sexual  activity for three days.   Low cholesterol diet.   Gently wash your wounds out with water and soap.   Call [phone] if any bleeding or increased __________  groin site.   Keep your appointment with Dr. Elsie Lincoln.  Please note that the patient had  made an appointment to see Dr. Elsie Lincoln prior to this admission because he has  a friend who is a patient of Dr. Elsie Lincoln who had referred him to Dr. Elsie Lincoln.      Mary B. Easley, P.A.-C.                   Madaline Savage, M.D.    MBE/MEDQ  D:  11/14/2003  T:  11/15/2003  Job:  [id number]   cc:   Madaline Savage, M.D.  [address], [address]  [place]  [place] [address]  Fax: [phone]

## 2011-03-10 ENCOUNTER — Emergency Department: Payer: Self-pay | Admitting: *Deleted

## 2011-04-18 ENCOUNTER — Observation Stay (HOSPITAL_COMMUNITY)
Admission: EM | Admit: 2011-04-18 | Discharge: 2011-04-19 | DRG: 143 | Disposition: A | Discharge status: Home / Self Care | Payer: BC Managed Care – PPO | Source: Ambulatory Visit | Attending: Cardiology | Admitting: Cardiology

## 2011-04-18 ENCOUNTER — Encounter: Payer: Self-pay | Admitting: Emergency Medicine

## 2011-04-18 ENCOUNTER — Emergency Department (HOSPITAL_COMMUNITY): Payer: BC Managed Care – PPO

## 2011-04-18 ENCOUNTER — Other Ambulatory Visit: Payer: Self-pay

## 2011-04-18 DIAGNOSIS — K219 Gastro-esophageal reflux disease without esophagitis: Secondary | ICD-10-CM | POA: Insufficient documentation

## 2011-04-18 DIAGNOSIS — R209 Unspecified disturbances of skin sensation: Secondary | ICD-10-CM | POA: Insufficient documentation

## 2011-04-18 DIAGNOSIS — I2510 Atherosclerotic heart disease of native coronary artery without angina pectoris: Secondary | ICD-10-CM | POA: Insufficient documentation

## 2011-04-18 DIAGNOSIS — E785 Hyperlipidemia, unspecified: Secondary | ICD-10-CM | POA: Diagnosis present

## 2011-04-18 DIAGNOSIS — E1169 Type 2 diabetes mellitus with other specified complication: Secondary | ICD-10-CM | POA: Diagnosis present

## 2011-04-18 DIAGNOSIS — Z8679 Personal history of other diseases of the circulatory system: Secondary | ICD-10-CM

## 2011-04-18 DIAGNOSIS — I10 Essential (primary) hypertension: Secondary | ICD-10-CM | POA: Diagnosis not present

## 2011-04-18 DIAGNOSIS — R0789 Other chest pain: Principal | ICD-10-CM | POA: Insufficient documentation

## 2011-04-18 DIAGNOSIS — Z9861 Coronary angioplasty status: Secondary | ICD-10-CM | POA: Insufficient documentation

## 2011-04-18 HISTORY — DX: Unspecified convulsions: R56.9

## 2011-04-18 HISTORY — DX: Gastro-esophageal reflux disease without esophagitis: K21.9

## 2011-04-18 LAB — POCT I-STAT, CHEM 8
BUN: 18 mg/dL (ref 6–23)
Calcium, Ion: 1.26 mmol/L (ref 1.12–1.32)
Creatinine, Ser: 1.1 mg/dL (ref 0.50–1.35)
Glucose, Bld: 79 mg/dL (ref 70–99)
Hemoglobin: 14.6 g/dL (ref 13.0–17.0)
TCO2: 29 mmol/L (ref 0–100)

## 2011-04-18 LAB — DIFFERENTIAL
Basophils Absolute: 0 10*3/uL (ref 0.0–0.1)
Basophils Relative: 1 % (ref 0–1)
Eosinophils Absolute: 0.2 10*3/uL (ref 0.0–0.7)
Eosinophils Relative: 5 % (ref 0–5)
Lymphocytes Relative: 38 % (ref 12–46)
Lymphs Abs: 1.9 10*3/uL (ref 0.7–4.0)
Monocytes Absolute: 0.6 10*3/uL (ref 0.1–1.0)
Monocytes Relative: 12 % (ref 3–12)
Neutro Abs: 2.2 10*3/uL (ref 1.7–7.7)
Neutrophils Relative %: 44 % (ref 43–77)

## 2011-04-18 LAB — CBC
HCT: 41.8 % (ref 39.0–52.0)
Hemoglobin: 14.5 g/dL (ref 13.0–17.0)
MCH: 29.4 pg (ref 26.0–34.0)
MCHC: 34.7 g/dL (ref 30.0–36.0)
MCV: 84.8 fL (ref 78.0–100.0)
Platelets: 190 10*3/uL (ref 150–400)
RBC: 4.93 MIL/uL (ref 4.22–5.81)
RDW: 12.2 % (ref 11.5–15.5)
WBC: 4.9 10*3/uL (ref 4.0–10.5)

## 2011-04-18 NOTE — ED Notes (Signed)
PT. REPORTS LEFT CHEST PAIN WITH LEFT ARM NUMBNESS ONSET THIS AFTERNOON ,  NO SOB ,  NO NAUSEA ,  NO DIAPHORESIS. SLIGHT HEADACHE . RATES PAIN 2/10 AT TRIAGE.

## 2011-04-18 NOTE — ED Notes (Signed)
States at registration, pre-triage: "had stent placed in April, sx tonight feel the same as it did then".

## 2011-04-19 ENCOUNTER — Encounter (HOSPITAL_COMMUNITY): Payer: Self-pay | Admitting: *Deleted

## 2011-04-19 ENCOUNTER — Other Ambulatory Visit: Payer: Self-pay

## 2011-04-19 DIAGNOSIS — E1169 Type 2 diabetes mellitus with other specified complication: Secondary | ICD-10-CM | POA: Diagnosis present

## 2011-04-19 DIAGNOSIS — Z8679 Personal history of other diseases of the circulatory system: Secondary | ICD-10-CM

## 2011-04-19 DIAGNOSIS — I10 Essential (primary) hypertension: Secondary | ICD-10-CM | POA: Diagnosis not present

## 2011-04-19 DIAGNOSIS — E785 Hyperlipidemia, unspecified: Secondary | ICD-10-CM | POA: Diagnosis present

## 2011-04-19 LAB — LIPID PANEL
Cholesterol: 121 mg/dL (ref 0–200)
HDL: 48 mg/dL (ref >39)
Total CHOL/HDL Ratio: 2.5 RATIO
Triglycerides: 67 mg/dL (ref <150)

## 2011-04-19 LAB — GLUCOSE, CAPILLARY: Glucose-Capillary: 114 mg/dL — ABNORMAL HIGH (ref 70–99)

## 2011-04-19 LAB — CARDIAC PANEL(CRET KIN+CKTOT+MB+TROPI)
CK, MB: 1.9 ng/mL (ref 0.3–4.0)
Relative Index: INVALID (ref 0.0–2.5)
Relative Index: INVALID (ref 0.0–2.5)
Total CK: 76 U/L (ref 7–232)
Troponin I: <0.3 ng/mL (ref <0.30)

## 2011-04-19 LAB — TSH: TSH: 0.357 u[IU]/mL (ref 0.350–4.500)

## 2011-04-19 MED ORDER — RAMIPRIL 5 MG PO CAPS
5.0000 mg | ORAL_CAPSULE | Freq: Every day | ORAL | Status: DC
  Filled 2011-04-19: qty 1

## 2011-04-19 MED ORDER — ASPIRIN EC 81 MG PO TBEC: 81.0000 mg | DELAYED_RELEASE_TABLET | Freq: Every day | ORAL | Status: DC

## 2011-04-19 MED ORDER — ACETAMINOPHEN 325 MG PO TABS: 650.0000 mg | ORAL_TABLET | ORAL | Status: DC | PRN

## 2011-04-19 MED ORDER — ONDANSETRON HCL 4 MG/2ML IJ SOLN: 4.0000 mg | Freq: Four times a day (QID) | INTRAMUSCULAR | Status: DC | PRN

## 2011-04-19 MED ORDER — PANTOPRAZOLE SODIUM 40 MG PO TBEC: 40.0000 mg | DELAYED_RELEASE_TABLET | Freq: Every day | ORAL | Status: DC

## 2011-04-19 MED ORDER — PRASUGREL HCL 10 MG PO TABS
10.0000 mg | ORAL_TABLET | Freq: Every day | ORAL | Status: DC
  Administered 2011-04-19: 10 mg via ORAL
  Filled 2011-04-19: qty 1

## 2011-04-19 MED ORDER — SIMVASTATIN 20 MG PO TABS
20.0000 mg | ORAL_TABLET | Freq: Every day | ORAL | Status: DC
  Filled 2011-04-19: qty 1

## 2011-04-19 MED ORDER — ASPIRIN 81 MG PO CHEW
162.0000 mg | CHEWABLE_TABLET | Freq: Once | ORAL | Status: AC
  Administered 2011-04-19: 162 mg via ORAL

## 2011-04-19 MED ORDER — ASPIRIN 81 MG PO CHEW
CHEWABLE_TABLET | ORAL | Status: AC
  Administered 2011-04-19: 162 mg via ORAL
  Filled 2011-04-19: qty 2

## 2011-04-19 MED ORDER — ASPIRIN EC 81 MG PO TBEC: 162.0000 mg | DELAYED_RELEASE_TABLET | Freq: Every day | ORAL | Status: DC

## 2011-04-19 MED ORDER — PANTOPRAZOLE SODIUM 40 MG PO TBEC
40.0000 mg | DELAYED_RELEASE_TABLET | Freq: Every day | ORAL | Status: DC
  Administered 2011-04-19: 40 mg via ORAL
  Filled 2011-04-19: qty 1

## 2011-04-19 MED ORDER — ACETAMINOPHEN 325 MG PO TABS: 650.0000 mg | ORAL_TABLET | ORAL | Status: AC | PRN

## 2011-04-19 MED ORDER — ASPIRIN EC 81 MG PO TBEC
162.0000 mg | DELAYED_RELEASE_TABLET | Freq: Every day | ORAL | Status: DC
  Administered 2011-04-19: 162 mg via ORAL
  Filled 2011-04-19: qty 2

## 2011-04-19 MED ORDER — NITROGLYCERIN 0.4 MG SL SUBL: 0.4000 mg | SUBLINGUAL_TABLET | SUBLINGUAL | Status: DC | PRN

## 2011-04-19 MED ORDER — METOPROLOL SUCCINATE ER 25 MG PO TB24
25.0000 mg | ORAL_TABLET | Freq: Every day | ORAL | Status: DC
  Administered 2011-04-19: 25 mg via ORAL
  Filled 2011-04-19: qty 1

## 2011-04-19 MED ORDER — METOPROLOL SUCCINATE ER 25 MG PO TB24: 25.0000 mg | ORAL_TABLET | Freq: Every day | ORAL | Status: DC

## 2011-04-19 NOTE — H&P (Signed)
[name] is an 60 y.o. male.   Chief Complaint: Chest pain with left arm numbness and tingling  HPI:  Patient has a history of coronary disease, status post cath in 2005 with moderate disease. Follow-up cath in Feb 2012 with a LAD lesion treated with a 3.0 x 12 mm drug-eluting stent, post dilated to 3.25 mm in the mid LAD. Thirty percent circumflex was also noted with an EF  > 60% on a re-look cath(07/2010) showed widely patent stent.  His history also includes hypertension, well controlled, Dyslipidemia, Borderline diabetes, History of PSVT(very infrequent episodes). Patient was just seen in the office by Dr. Herbie Baltimore yesterday. He complains of some chest tightness he calls it a "knot" in the left side with radiation to the left arm causing numbness and paresthesia. The intensity was approximately 2/10. It occurred at approximately 1530 hours. Patient states she's had no neck pain he's had previous episodes. He denies shortness of breath, diaphoresis, nausea, vomiting,palpitations, or extremity edema, cough, congestion, abdominal pain, melena, hematochezia, dysuria, hematuria.  He has been under more stress lately. 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  Past Medical History  Diagnosis Date  . Coronary artery disease   . GERD (gastroesophageal reflux disease)     Past Surgical History  Procedure Date  . Carotid stent     No family history on file. Social History:  reports that he has never smoked. He does not have any smokeless tobacco history on file. He reports that he does not drink alcohol or use illicit drugs.  Allergies: No Known Allergies  Medications Prior to Admission  Medication Dose Route Frequency Provider Last Rate Last Dose  . aspirin chewable tablet 162 mg  162 mg Oral Once Raeford Razor, MD   162 mg at 04/19/11 0051   No current outpatient prescriptions on file as of 04/18/2011.   Medications Toprol XL 20 mg  daily Diclofenac when necessary Aspirin 81 mg 2 times daily Nitroglycerin sublingual when necessary Effient 10 mg Ramipril 5 mg each bedtime Protonix 40 mg twice a day Pravastatin 20 mg daily Lovastatin when necessary  Results for orders placed during the hospital encounter of 04/18/11 (from the past 48 hour(s))  CBC     Status: Normal   Collection Time   04/18/11 11:28 PM      Component Value Range Comment   WBC 4.9  4.0 - 10.5 (K/uL)    RBC 4.93  4.22 - 5.81 (MIL/uL)    Hemoglobin 14.5  13.0 - 17.0 (g/dL)    HCT 78.2  95.6 - 21.3 (%)    MCV 84.8  78.0 - 100.0 (fL)    MCH 29.4  26.0 - 34.0 (pg)    MCHC 34.7  30.0 - 36.0 (g/dL)    RDW 08.6  57.8 - 46.9 (%)    Platelets 190  150 - 400 (K/uL)   DIFFERENTIAL     Status: Normal   Collection Time   04/18/11 11:28 PM      Component Value Range Comment   Neutrophils Relative 44  43 - 77 (%)    Neutro Abs 2.2  1.7 - 7.7 (K/uL)    Lymphocytes Relative 38  12 - 46 (%)    Lymphs Abs 1.9  0.7 - 4.0 (K/uL)    Monocytes Relative 12  3 - 12 (%)    Monocytes Absolute 0.6  0.1 - 1.0 (K/uL)  Eosinophils Relative 5  0 - 5 (%)    Eosinophils Absolute 0.2  0.0 - 0.7 (K/uL)    Basophils Relative 1  0 - 1 (%)    Basophils Absolute 0.0  0.0 - 0.1 (K/uL)   POCT I-STAT TROPONIN I     Status: Normal   Collection Time   04/18/11 11:55 PM      Component Value Range Comment   Troponin i, poc 0.00  0.00 - 0.08 (ng/mL)    Comment 3            POCT I-STAT, CHEM 8     Status: Normal   Collection Time   04/18/11 11:56 PM      Component Value Range Comment   Sodium 142  135 - 145 (mEq/L)    Potassium 4.2  3.5 - 5.1 (mEq/L)    Chloride 104  96 - 112 (mEq/L)    BUN 18  6 - 23 (mg/dL)    Creatinine, Ser 1.61  0.50 - 1.35 (mg/dL)    Glucose, Bld 79  70 - 99 (mg/dL)    Calcium, Ion 0.96  1.12 - 1.32 (mmol/L)    TCO2 29  0 - 100 (mmol/L)    Hemoglobin 14.6  13.0 - 17.0 (g/dL)    HCT 04.5  40.9 - 81.1 (%)    Dg Chest 2 View  04/18/2011   *RADIOLOGY REPORT*  Clinical Data: Chest pain  CHEST - 2 VIEW  Comparison: 07/01/2010  Findings: Lungs are clear. No pleural effusion or pneumothorax. The cardiomediastinal contours are within normal limits. The visualized bones and soft tissues are without significant appreciable abnormality.  IMPRESSION: No acute cardiopulmonary process.  Original Report Authenticated By: Waneta Martins, M.D.    Review of Systems  Constitutional: Negative for diaphoresis.  HENT: Negative for congestion and neck pain.   Eyes: Negative.   Respiratory: Negative for cough, shortness of breath and wheezing.   Cardiovascular: Positive for chest pain. Negative for palpitations, orthopnea, leg swelling and PND.  Gastrointestinal: Negative for nausea, vomiting, abdominal pain, diarrhea, constipation, blood in stool and melena.  Genitourinary: Negative for dysuria and hematuria.  Neurological: Positive for sensory change. Negative for dizziness.  Psychiatric/Behavioral: Negative.   All other systems reviewed and are negative.   Blood pressure 110/62, pulse 71, temperature 98.2 F (36.8 C), temperature source Oral, resp. rate 11, SpO2 99.00%. Physical Exam  Constitutional: He is oriented to person, place, and time. He appears well-developed and well-nourished. No distress.  HENT:  Head: Normocephalic and atraumatic.  Eyes: EOM are normal. Pupils are equal, round, and reactive to light. No scleral icterus.  Neck: Normal range of motion. No JVD present.  Cardiovascular: Normal rate, regular rhythm and intact distal pulses.   No murmur heard.      No carotid or femoral bruits  Respiratory: Effort normal and breath sounds normal. He has no wheezes. He has no rales.  GI: Soft. Bowel sounds are normal. He exhibits no distension. There is no tenderness.  Musculoskeletal: He exhibits no edema.  Lymphadenopathy:    He has no cervical adenopathy.  Neurological: He is alert and oriented to person, place, and time. He  exhibits normal muscle tone.  Skin: Skin is warm and dry.  Psychiatric: He has a normal mood and affect.   EKG normal sinus rhythm   Assessment/Plan Patient Active Hospital Problem List:  Chest pain (04/19/2011) CAD (coronary artery disease): Cath April 2012 with  DES to LAD (04/19/2011) HTN (hypertension) (04/19/2011)  HLD (hyperlipidemia) (04/19/2011) History of PSVT (paroxysmal supraventricular tachycardia) (04/19/2011)  Plan: the patient is currently chest pain-free. He will be admitted to telemetry floor. Cycle cardiac enzymes x3 every 6 hours. Use sublingual musculature and chest pain. Continue home medications. Will schedule nuclear stress test as OP if cardiac enzymes negative and continues to be pain free.   Will keep NPO for now   HAGER,BRYAN W 04/19/2011, 3:04 AM  Agree with note written by Jones Skene PAC  Pt with known CAD admitted with atypical CP. Also has H/O GERD. Enz neg. EKG without acute changes. Exam benign. OK to D/C home. Exercise myoview in the office tomorrow.  Runell Gess 04/19/2011 1:38 PM

## 2011-04-19 NOTE — ED Notes (Signed)
Cardiology in with pt.

## 2011-04-19 NOTE — ED Provider Notes (Signed)
History    60ym with CP. Onset around 1430 today. Gradual onset. Doesn't remember what was doing. Sensation of numbness in LUE. Resolved after about 20 minutes. Currently no complaints. Triage note reviewed, but pt denied HA to me. No fever or chills. No cough. No sob. No n/v. No back pain. Known CAD s/p stent this past spring. Saw cardiologist this am and says they "told me my EKG looked good."   CSN: [id number] Arrival date & time: 04/18/2011  9:54 PM   First MD Initiated Contact with Patient 04/19/11 0030      Chief Complaint  Patient presents with  . Chest Pain    (Consider location/radiation/quality/duration/timing/severity/associated sxs/prior treatment) HPI  Past Medical History  Diagnosis Date  . Coronary artery disease   . GERD (gastroesophageal reflux disease)     Past Surgical History  Procedure Date  . Carotid stent     No family history on file.  History  Substance Use Topics  . Smoking status: Never Smoker   . Smokeless tobacco: Not on file  . Alcohol Use: No      Review of Systems   Review of symptoms negative unless otherwise noted in HPI.   Allergies  Review of patient's allergies indicates no known allergies.  Home Medications   Current Outpatient Rx  Name Route Sig Dispense Refill  . ASPIRIN EC 81 MG PO TBEC Oral Take 162 mg by mouth daily.      Marland Kitchen NEXIUM PO Oral Take 1 capsule by mouth daily.      Marland Kitchen LORAZEPAM PO Oral Take 0.5 tablets by mouth.      . METOPROLOL TARTRATE 25 MG PO TABS Oral Take 25 mg by mouth daily.      Marland Kitchen NITROGLYCERIN 0.4 MG SL SUBL Sublingual Place 0.4 mg under the tongue every 5 (five) minutes as needed.      Marland Kitchen EFFIENT PO Oral Take 1 tablet by mouth daily.      Marland Kitchen PRAVASTATIN SODIUM 40 MG PO TABS Oral Take 40 mg by mouth daily.      Marland Kitchen RAMIPRIL PO Oral Take 1 tablet by mouth daily.        BP 119/76  Pulse 86  Temp(Src) 98.2 F (36.8 C) (Oral)  Resp 18  SpO2 98%  Physical Exam  Nursing note and vitals  reviewed. Constitutional: He appears well-developed and well-nourished. No distress.  HENT:  Head: Normocephalic and atraumatic.  Right Ear: External ear normal.  Left Ear: External ear normal.  Eyes: Conjunctivae are normal. Pupils are equal, round, and reactive to light. Right eye exhibits no discharge. Left eye exhibits no discharge.  Neck: Normal range of motion. Neck supple.  Cardiovascular: Normal rate, regular rhythm and normal heart sounds.  Exam reveals no gallop and no friction rub.   No murmur heard. Pulmonary/Chest: Effort normal and breath sounds normal. No respiratory distress. He exhibits no tenderness.  Abdominal: Soft. He exhibits no distension. There is no tenderness.  Musculoskeletal: Normal range of motion. He exhibits no edema and no tenderness.  Lymphadenopathy:    He has no cervical adenopathy.  Neurological: He is alert.  Skin: Skin is warm and dry. He is not diaphoretic.       Small dime sized scar L upper back.   Psychiatric: He has a normal mood and affect. His behavior is normal. Thought content normal.    ED Course  Procedures (including critical care time)  Labs Reviewed  HEMOGLOBIN A1C - Abnormal; Notable for the following:  Hemoglobin A1C 5.8 (*)    Mean Plasma Glucose 120 (*)    All other components within normal limits  GLUCOSE, CAPILLARY - Abnormal; Notable for the following:    Glucose-Capillary 114 (*)    All other components within normal limits  CBC  DIFFERENTIAL  POCT I-STAT, CHEM 8  POCT I-STAT TROPONIN I  LIPID PANEL  CARDIAC PANEL(CRET KIN+CKTOT+MB+TROPI)  CARDIAC PANEL(CRET KIN+CKTOT+MB+TROPI)  TSH  LAB REPORT - SCANNED   Dg Chest 2 View  04/18/2011  *RADIOLOGY REPORT*  Clinical Data: Chest pain  CHEST - 2 VIEW  Comparison: 07/01/2010  Findings: Lungs are clear. No pleural effusion or pneumothorax. The cardiomediastinal contours are within normal limits. The visualized bones and soft tissues are without significant appreciable  abnormality.  IMPRESSION: No acute cardiopulmonary process.  Original Report Authenticated By: Waneta Martins, M.D.   EKG:  Rhythm: normal sinus Rate:  79 Axis: normal Intervals: normal ST segments: NS ST changes in aVL  Update note: Dsicussed with Dr Herbie Baltimore, cards. Had actually evaluated pt in office earlier this am. Initially was planning on stressing in a few months. Given that has had multiple episodes of these symptoms though pt few days plans to just admit now , r/o and possibly cath. Will have PA evaluate in ED.   1. Chest pain       MDM  [name] with CP. Pt known to Dr Herbie Baltimore who actually evaluated earlier today. Discussed case and will admit for further eval. EKG nondiagnostic and trop wnl.        Raeford Razor, MD 04/26/11 [phone]

## 2011-04-19 NOTE — Progress Notes (Signed)
Pt. Discharged to home per MD order. VSS with no complaints of chest pain, SOB or dizziness. All discharge information and medication list reviewed with patient. [name]

## 2011-04-19 NOTE — ED Notes (Signed)
PT to ED c/o intermittent L chest numbness that radiates to L arm since this afternoon. He states if feels the same way as it did when he had a stent placement this April.  Nondiaphoretic, denies sob, pain or nausea.  He has been under a lot of stress lately.  He was recently seen at [facility] where he was tx with Adenosine for svt and released that day.

## 2011-04-19 NOTE — Progress Notes (Signed)
CSW attempted to see patient today to discuss concerns, but patient had already discharged.   Rozetta Nunnery MSW, [employer] [phone]

## 2011-04-19 NOTE — Discharge Summary (Signed)
Coty Larsh C. Avaeh Ewer, MD Attending Cardiologist [facility]  

## 2011-04-19 NOTE — ED Notes (Signed)
Dr Herbie Baltimore with [facility] called for orders.  States he is writing orders now.

## 2011-04-19 NOTE — ED Notes (Signed)
Pt to ED c/o L sided chest numbness that radiated to his L arm this am.

## 2011-04-19 NOTE — Discharge Summary (Signed)
Patient ID: [name],  MRN: [id number], DOB/AGE: 1950/05/14 60 y.o.  Admit date: 04/18/2011 Discharge date: 04/19/2011  Primary Care Provider:  Primary Cardiologist: Dr Herbie Baltimore  Discharge Diagnoses Principal Problem:  *Chest pain Active Problems:  CAD (coronary artery disease): Cath April 2012 with  DES to LAD  HTN (hypertension)  HLD (hyperlipidemia)  History of PSVT (paroxysmal supraventricular tachycardia)    Procedures: None    Hospital Course :Patient has a history of coronary disease, status post cath in 2005 with moderate disease. Follow-up cath in Feb 2012 with a LAD lesion treated with a 3.0 x 12 mm drug-eluting stent, post dilated to 3.25 mm in the mid LAD. Thirty percent circumflex was also noted with an EF > 60% on a re-look cath(07/2010) showed widely patent stent. His history also includes hypertension, well controlled, Dyslipidemia, Borderline diabetes, History of PSVT(very infrequent episodes). Patient was just seen in the office by Dr. Herbie Baltimore 12/16. He complains of some chest tightness he calls it a "knot" in the left side with radiation to the left arm causing numbness and paresthesia. The intensity was approximately 2/10. It occurred at approximately 1530 hours. Patient states she's had no neck pain he's had previous episodes. He denies shortness of breath, diaphoresis, nausea, vomiting,palpitations, or extremity edema, cough, congestion, abdominal pain, melena, hematochezia, dysuria, hematuria. He has been under more stress lately. 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 He was admitted to telemetry and ruled out for an MI. He is pain free at discharge and Dr Allyson Sabal feels he can be discharged with plans for an out-patient Myoview. His beta blocker was held at discharge.   Discharge Vitals:  Blood pressure 137/86, pulse 94, temperature 97.9 F (36.6 C), temperature source Oral, resp. rate 15, SpO2 98.00%.      Labs: Results for orders placed during the hospital encounter of 04/18/11 (from the past 48 hour(s))  CBC     Status: Normal   Collection Time   04/18/11 11:28 PM      Component Value Range Comment   WBC 4.9  4.0 - 10.5 (K/uL)    RBC 4.93  4.22 - 5.81 (MIL/uL)    Hemoglobin 14.5  13.0 - 17.0 (g/dL)    HCT 40.9  81.1 - 91.4 (%)    MCV 84.8  78.0 - 100.0 (fL)    MCH 29.4  26.0 - 34.0 (pg)    MCHC 34.7  30.0 - 36.0 (g/dL)    RDW 78.2  95.6 - 21.3 (%)    Platelets 190  150 - 400 (K/uL)   DIFFERENTIAL     Status: Normal   Collection Time   04/18/11 11:28 PM      Component Value Range Comment   Neutrophils Relative 44  43 - 77 (%)    Neutro Abs 2.2  1.7 - 7.7 (K/uL)    Lymphocytes Relative 38  12 - 46 (%)    Lymphs Abs 1.9  0.7 - 4.0 (K/uL)    Monocytes Relative 12  3 - 12 (%)    Monocytes Absolute 0.6  0.1 - 1.0 (K/uL)    Eosinophils Relative 5  0 - 5 (%)    Eosinophils Absolute 0.2  0.0 - 0.7 (K/uL)    Basophils Relative 1  0 - 1 (%)    Basophils Absolute 0.0  0.0 - 0.1 (K/uL)   POCT I-STAT TROPONIN I     Status: Normal  Collection Time   04/18/11 11:55 PM      Component Value Range Comment   Troponin i, poc 0.00  0.00 - 0.08 (ng/mL)    Comment 3            POCT I-STAT, CHEM 8     Status: Normal   Collection Time   04/18/11 11:56 PM      Component Value Range Comment   Sodium 142  135 - 145 (mEq/L)    Potassium 4.2  3.5 - 5.1 (mEq/L)    Chloride 104  96 - 112 (mEq/L)    BUN 18  6 - 23 (mg/dL)    Creatinine, Ser 6.57  0.50 - 1.35 (mg/dL)    Glucose, Bld 79  70 - 99 (mg/dL)    Calcium, Ion 8.46  1.12 - 1.32 (mmol/L)    TCO2 29  0 - 100 (mmol/L)    Hemoglobin 14.6  13.0 - 17.0 (g/dL)    HCT 96.2  95.2 - 84.1 (%)   GLUCOSE, CAPILLARY     Status: Abnormal   Collection Time   04/19/11  4:44 AM      Component Value Range Comment   Glucose-Capillary 114 (*) 70 - 99 (mg/dL)   LIPID PANEL     Status: Normal   Collection Time   04/19/11  5:20 AM      Component Value  Range Comment   Cholesterol 121  0 - 200 (mg/dL)    Triglycerides 67  <324 (mg/dL)    HDL 48  >40 (mg/dL)    Total CHOL/HDL Ratio 2.5      VLDL 13  0 - 40 (mg/dL)    LDL Cholesterol 60  0 - 99 (mg/dL)   CARDIAC PANEL(CRET KIN+CKTOT+MB+TROPI)     Status: Normal   Collection Time   04/19/11  5:20 AM      Component Value Range Comment   Total CK 84  7 - 232 (U/L)    CK, MB 2.1  0.3 - 4.0 (ng/mL)    Troponin I <0.30  <0.30 (ng/mL)    Relative Index RELATIVE INDEX IS INVALID  0.0 - 2.5    CARDIAC PANEL(CRET KIN+CKTOT+MB+TROPI)     Status: Normal   Collection Time   04/19/11 11:53 AM      Component Value Range Comment   Total CK 76  7 - 232 (U/L)    CK, MB 1.9  0.3 - 4.0 (ng/mL)    Troponin I <0.30  <0.30 (ng/mL)    Relative Index RELATIVE INDEX IS INVALID  0.0 - 2.5      Disposition:  Follow-up Information    Follow up with [facility].      Follow up with HARDING,DAVID W. (office will call)    Contact information:   [facility] [address] [place] [address] [phone]          Discharge Medications:  Current Discharge Medication List    START taking these medications   Details  acetaminophen (TYLENOL) 325 MG tablet Take 2 tablets (650 mg total) by mouth every 4 (four) hours as needed. Qty: 30 tablet      CONTINUE these medications which have CHANGED   Details  aspirin EC 81 MG tablet Take 1 tablet (81 mg total) by mouth daily.    metoprolol succinate (TOPROL-XL) 25 MG 24 hr tablet Take 1 tablet (25 mg total) by mouth daily.    pantoprazole (PROTONIX) 40 MG tablet Take 1  tablet (40 mg total) by mouth daily.      CONTINUE these medications which have NOT CHANGED   Details  clonazePAM (KLONOPIN) 0.5 MG tablet Take 0.5-1 mg by mouth 2 (two) times daily as needed. For anxiety     LORAZEPAM PO Take 0.5 tablets by mouth.     nitroGLYCERIN (NITROSTAT) 0.4 MG SL tablet Place 0.4 mg under the tongue every 5  (five) minutes as needed.      prasugrel (EFFIENT) 10 MG TABS Take 10 mg by mouth every other day.      pravastatin (PRAVACHOL) 20 MG tablet Take 20 mg by mouth daily.      ramipril (ALTACE) 5 MG capsule Take 5 mg by mouth daily.        STOP taking these medications     tadalafil (CIALIS) 20 MG tablet         Outstanding Labs/Studies: None  Duration of Discharge Encounter: Greater than 30 minutes including physician time.  Jolene Provost PA-C 04/19/2011 1:52 PM

## 2011-11-26 ENCOUNTER — Emergency Department (HOSPITAL_COMMUNITY)
Admission: EM | Admit: 2011-11-26 | Discharge: 2011-11-26 | Disposition: A | Discharge status: Home / Self Care | Payer: BC Managed Care – PPO | Attending: Emergency Medicine | Admitting: Emergency Medicine

## 2011-11-26 ENCOUNTER — Encounter (HOSPITAL_COMMUNITY): Payer: Self-pay | Admitting: *Deleted

## 2011-11-26 ENCOUNTER — Emergency Department (HOSPITAL_COMMUNITY): Payer: BC Managed Care – PPO

## 2011-11-26 DIAGNOSIS — Z9861 Coronary angioplasty status: Secondary | ICD-10-CM | POA: Insufficient documentation

## 2011-11-26 DIAGNOSIS — R42 Dizziness and giddiness: Secondary | ICD-10-CM | POA: Insufficient documentation

## 2011-11-26 DIAGNOSIS — K219 Gastro-esophageal reflux disease without esophagitis: Secondary | ICD-10-CM | POA: Insufficient documentation

## 2011-11-26 DIAGNOSIS — R079 Chest pain, unspecified: Secondary | ICD-10-CM | POA: Insufficient documentation

## 2011-11-26 DIAGNOSIS — R0602 Shortness of breath: Secondary | ICD-10-CM | POA: Insufficient documentation

## 2011-11-26 DIAGNOSIS — I2510 Atherosclerotic heart disease of native coronary artery without angina pectoris: Secondary | ICD-10-CM | POA: Insufficient documentation

## 2011-11-26 LAB — CBC
Hemoglobin: 14.7 g/dL (ref 13.0–17.0)
MCH: 29 pg (ref 26.0–34.0)
RBC: 5.07 MIL/uL (ref 4.22–5.81)

## 2011-11-26 LAB — BASIC METABOLIC PANEL
CO2: 22 mEq/L (ref 19–32)
Calcium: 9.4 mg/dL (ref 8.4–10.5)
Glucose, Bld: 139 mg/dL — ABNORMAL HIGH (ref 70–99)
Potassium: 4.3 mEq/L (ref 3.5–5.1)
Sodium: 139 mEq/L (ref 135–145)

## 2011-11-26 LAB — POCT I-STAT TROPONIN I
Troponin i, poc: 0 ng/mL (ref 0.00–0.08)
Troponin i, poc: 0 ng/mL (ref 0.00–0.08)

## 2011-11-26 LAB — CK TOTAL AND CKMB (NOT AT ARMC)
CK, MB: 2.7 ng/mL (ref 0.3–4.0)
Relative Index: 2.2 (ref 0.0–2.5)

## 2011-11-26 MED ORDER — ASPIRIN 81 MG PO CHEW
324.0000 mg | CHEWABLE_TABLET | Freq: Once | ORAL | Status: AC
  Administered 2011-11-26: 324 mg via ORAL
  Filled 2011-11-26: qty 4

## 2011-11-26 NOTE — ED Notes (Signed)
Patient is resting comfortably. 

## 2011-11-26 NOTE — ED Notes (Signed)
Reports having intermittent sob and epigastric pains x 2 weeks, was walking this am and had sob, felt lightheaded and palpitations. Took 1 nitro pta, denies any cp. ekg done at triage, airway is intact.

## 2011-11-26 NOTE — ED Notes (Signed)
Pt reports feeling dizzy, posterior head and neck pain w/activity, walking, bending, and lifting, intermittent (L) arm tingling, epigastric pain, and SOB that increases w/activity. Pt reports "rapid heart beat" this am w/activity. Pt reports having a stent placed approx 1.5 years ago w/a 95% blockage, pt reports his symptoms today feel similar to when he needed to have his stent placed. Pt denies chest/back pain, N/V/D, or diaphoretic.

## 2011-11-26 NOTE — ED Provider Notes (Addendum)
History     CSN: [id number]  Arrival date & time 11/26/11  7846   First MD Initiated Contact with Patient 11/26/11 [phone]      Chief Complaint  Patient presents with  . Shortness of Breath    (Consider location/radiation/quality/duration/timing/severity/associated sxs/prior treatment) HPI Comments: [name] presents ambulatory for evaluation.  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  He reports the symptoms are brought on by mild to moderate exertion.  Today he became more alarmed because those symptoms were also associated with chest discomfort.  Patient is a 61 y.o. male presenting with shortness of breath. The history is provided by the patient.  Shortness of Breath  The current episode started more than 1 week ago. The onset was gradual. The problem occurs occasionally. The problem has been gradually worsening. The problem is mild. Relieved by: pt is unsure, today also had associated chest discomfort and a took 1  sublingual ntg tab with some improvement. The symptoms are aggravated by activity. Associated symptoms include chest pain, chest pressure and shortness of breath. Pertinent negatives include no orthopnea, no fever, no rhinorrhea, no sore throat, no stridor, no cough and no wheezing. Urine output has been normal. There were no sick contacts. Recently, medical care has been given by the PCP (routine office f/u).    Past Medical History  Diagnosis Date  . Coronary artery disease   . GERD (gastroesophageal reflux disease)   . Angina   . Seizures     in teenage year's    Past Surgical History  Procedure Date  . Carotid stent   . Coronary angioplasty 2012  . Hernia repair     History reviewed. No pertinent family history.  History  Substance Use Topics  . Smoking status:  Never Smoker   . Smokeless tobacco: Not on file  . Alcohol Use: No      Review of Systems  Constitutional: Negative for fever, chills, diaphoresis, activity change, appetite change and fatigue.  HENT: Positive for neck pain. Negative for hearing loss, ear pain, nosebleeds, sore throat, facial swelling, rhinorrhea, drooling, neck stiffness, tinnitus and ear discharge.   Eyes: Negative.   Respiratory: Positive for shortness of breath. Negative for apnea, cough, choking, chest tightness, wheezing and stridor.   Cardiovascular: Positive for chest pain. Negative for palpitations, orthopnea and leg swelling.  Gastrointestinal: Positive for abdominal pain. Negative for nausea, vomiting and diarrhea.       Reports has had mild abd discomfort that is not occurring now.  Genitourinary: Negative.   Musculoskeletal: Negative for joint swelling, arthralgias and gait problem.  Skin: Negative.   Neurological: Positive for dizziness, weakness, light-headedness and headaches. Negative for seizures, syncope, speech difficulty and numbness.       Denies vertigo  Hematological: Negative.   Psychiatric/Behavioral: Negative.     Allergies  Review of patient's allergies indicates no known allergies.  Home Medications   Current Outpatient Rx  Name Route Sig Dispense Refill  . ASPIRIN EC 81 MG PO TBEC Oral Take 162 mg by mouth daily.    Marland Kitchen ESOMEPRAZOLE MAGNESIUM 40 MG PO CPDR Oral Take 40 mg by mouth daily before breakfast.    . METOPROLOL SUCCINATE ER 25 MG PO TB24 Oral Take 25 mg by mouth daily.    Marland Kitchen NITROGLYCERIN 0.4 MG SL SUBL Sublingual Place 0.4  mg under the tongue every 5 (five) minutes as needed. For chest pain    . PRASUGREL HCL 10 MG PO TABS Oral Take 10 mg by mouth daily.     Marland Kitchen PRAVASTATIN SODIUM 40 MG PO TABS Oral Take 40 mg by mouth at bedtime.    Marland Kitchen RAMIPRIL 5 MG PO CAPS Oral Take 5 mg by mouth at bedtime.       BP 134/80  Pulse 84  Temp 98 F (36.7 C) (Oral)  Resp 20  SpO2  100%  Physical Exam  Constitutional: He is oriented to person, place, and time. He appears well-developed and well-nourished. No distress. He is not intubated.  HENT:  Head: Normocephalic and atraumatic.  Right Ear: External ear normal.  Left Ear: External ear normal.  Nose: Nose normal.  Mouth/Throat: Oropharynx is clear and moist. No oropharyngeal exudate.  Eyes: Conjunctivae and EOM are normal. Pupils are equal, round, and reactive to light. Right eye exhibits no discharge. Left eye exhibits no discharge. No scleral icterus.  Neck: Normal range of motion. Neck supple. No JVD present. No tracheal deviation present. No thyromegaly present.  Cardiovascular: Normal rate, regular rhythm, S1 normal, S2 normal, normal heart sounds and intact distal pulses.   No extrasystoles are present. Exam reveals no gallop, no distant heart sounds and no friction rub.   No murmur heard. Pulmonary/Chest: Effort normal. No accessory muscle usage or stridor. No apnea, not tachypneic and not bradypneic. He is not intubated. No respiratory distress. He has no wheezes. He has no rales. He exhibits no mass, no tenderness and no bony tenderness.  Abdominal: Soft. Bowel sounds are normal. He exhibits no shifting dullness, no distension and no mass. There is no tenderness. There is no rebound, no guarding and no CVA tenderness.  Musculoskeletal: Normal range of motion. He exhibits no tenderness.  Lymphadenopathy:    He has no cervical adenopathy.  Neurological: He is alert and oriented to person, place, and time. He has normal strength. No sensory deficit. Coordination and gait normal. GCS eye subscore is 4. GCS verbal subscore is 5. GCS motor subscore is 6.  Skin: Skin is warm and dry. No abrasion, no laceration and no rash noted. He is not diaphoretic. No erythema. No pallor.  Psychiatric: He has a normal mood and affect. His behavior is normal. Judgment and thought content normal.    ED Course  Procedures  (including critical care time)   Labs Reviewed  CBC  BASIC METABOLIC PANEL  CK TOTAL AND CKMB   No results found.   No diagnosis found.   Date: 01/25/2012  Rate: 94 bpm  Rhythm: sinus thythm  QRS Axis: normal  Intervals: normal  ST/T Wave abnormalities: normal  Conduction Disutrbances:none  Narrative Interpretation: motion artifact does obscure inferior leads, no evidence of STEMI noted  Old EKG Reviewed: none available      MDM   Pt presents for evaluation of episodic dizziness and chest discomfort.  He is currently pain free, NAD.  Plan routine CP eval including orthostatic VS.  Will await results and contact his cardiologist for further evaluation.  He has a known hx of CAD and SVT.  Pt pain free, CEs neg x1.  Pt's s/s are nonspecific.  Discussed with oncall MD for his cardiologist, plan repeat trop x1, if neg will d/c home with close outpt f/u.  Pt stable, repeat trop neg.  Plan d/c home.  Tobin Chad, MD 11/26/11 1510  Tobin Chad, MD 01/25/12 0800

## 2011-11-26 NOTE — ED Notes (Signed)
Family at bedside. 

## 2012-05-02 DIAGNOSIS — R55 Syncope and collapse: Secondary | ICD-10-CM | POA: Insufficient documentation

## 2012-05-02 DIAGNOSIS — Z87898 Personal history of other specified conditions: Secondary | ICD-10-CM

## 2012-05-02 HISTORY — PX: LOOP RECORDER IMPLANT: SHX5954

## 2012-05-02 HISTORY — DX: Personal history of other specified conditions: Z87.898

## 2012-05-02 HISTORY — PX: TRANSTHORACIC ECHOCARDIOGRAM: SHX275

## 2012-05-02 HISTORY — PX: NM MYOVIEW LTD: HXRAD82

## 2012-05-04 ENCOUNTER — Observation Stay (HOSPITAL_COMMUNITY)
Admission: EM | Admit: 2012-05-04 | Discharge: 2012-05-05 | Disposition: A | Discharge status: Home / Self Care | Payer: BC Managed Care – PPO | Attending: Internal Medicine | Admitting: Internal Medicine

## 2012-05-04 ENCOUNTER — Encounter (HOSPITAL_COMMUNITY): Payer: Self-pay

## 2012-05-04 ENCOUNTER — Emergency Department (HOSPITAL_COMMUNITY): Payer: BC Managed Care – PPO

## 2012-05-04 DIAGNOSIS — Z7982 Long term (current) use of aspirin: Secondary | ICD-10-CM | POA: Insufficient documentation

## 2012-05-04 DIAGNOSIS — K219 Gastro-esophageal reflux disease without esophagitis: Secondary | ICD-10-CM | POA: Insufficient documentation

## 2012-05-04 DIAGNOSIS — M79609 Pain in unspecified limb: Secondary | ICD-10-CM | POA: Insufficient documentation

## 2012-05-04 DIAGNOSIS — Z7902 Long term (current) use of antithrombotics/antiplatelets: Secondary | ICD-10-CM | POA: Insufficient documentation

## 2012-05-04 DIAGNOSIS — I2510 Atherosclerotic heart disease of native coronary artery without angina pectoris: Secondary | ICD-10-CM

## 2012-05-04 DIAGNOSIS — R5381 Other malaise: Secondary | ICD-10-CM | POA: Insufficient documentation

## 2012-05-04 DIAGNOSIS — R079 Chest pain, unspecified: Secondary | ICD-10-CM | POA: Insufficient documentation

## 2012-05-04 DIAGNOSIS — R209 Unspecified disturbances of skin sensation: Secondary | ICD-10-CM | POA: Insufficient documentation

## 2012-05-04 DIAGNOSIS — I471 Supraventricular tachycardia, unspecified: Secondary | ICD-10-CM | POA: Insufficient documentation

## 2012-05-04 DIAGNOSIS — R200 Anesthesia of skin: Secondary | ICD-10-CM | POA: Diagnosis present

## 2012-05-04 DIAGNOSIS — I10 Essential (primary) hypertension: Secondary | ICD-10-CM | POA: Diagnosis present

## 2012-05-04 DIAGNOSIS — R55 Syncope and collapse: Principal | ICD-10-CM | POA: Insufficient documentation

## 2012-05-04 DIAGNOSIS — E785 Hyperlipidemia, unspecified: Secondary | ICD-10-CM | POA: Insufficient documentation

## 2012-05-04 DIAGNOSIS — Z79899 Other long term (current) drug therapy: Secondary | ICD-10-CM | POA: Insufficient documentation

## 2012-05-04 DIAGNOSIS — Z8679 Personal history of other diseases of the circulatory system: Secondary | ICD-10-CM

## 2012-05-04 DIAGNOSIS — Z9861 Coronary angioplasty status: Secondary | ICD-10-CM | POA: Insufficient documentation

## 2012-05-04 DIAGNOSIS — E1169 Type 2 diabetes mellitus with other specified complication: Secondary | ICD-10-CM | POA: Diagnosis present

## 2012-05-04 DIAGNOSIS — M79603 Pain in arm, unspecified: Secondary | ICD-10-CM

## 2012-05-04 DIAGNOSIS — E7800 Pure hypercholesterolemia, unspecified: Secondary | ICD-10-CM | POA: Insufficient documentation

## 2012-05-04 LAB — COMPREHENSIVE METABOLIC PANEL
AST: 19 U/L (ref 0–37)
BUN: 19 mg/dL (ref 6–23)
Creatinine, Ser: 0.96 mg/dL (ref 0.50–1.35)
GFR calc Af Amer: >90 mL/min (ref >90)
GFR calc non Af Amer: 86 mL/min — ABNORMAL LOW (ref >90)
Glucose, Bld: 153 mg/dL — ABNORMAL HIGH (ref 70–99)
Potassium: 3.7 mEq/L (ref 3.5–5.1)
Sodium: 135 mEq/L (ref 135–145)
Total Bilirubin: 0.7 mg/dL (ref 0.3–1.2)

## 2012-05-04 LAB — CBC WITH DIFFERENTIAL/PLATELET
Basophils Relative: 1 % (ref 0–1)
Eosinophils Absolute: 0.4 10*3/uL (ref 0.0–0.7)
MCH: 30.1 pg (ref 26.0–34.0)
MCHC: 35.2 g/dL (ref 30.0–36.0)
Neutrophils Relative %: 61 % (ref 43–77)
Platelets: 203 10*3/uL (ref 150–400)
RDW: 12.3 % (ref 11.5–15.5)

## 2012-05-04 LAB — D-DIMER, QUANTITATIVE: D-Dimer, Quant: <0.27 ug/mL-FEU (ref 0.00–0.48)

## 2012-05-04 MED ORDER — ASPIRIN 81 MG PO CHEW
324.0000 mg | CHEWABLE_TABLET | Freq: Once | ORAL | Status: AC
  Administered 2012-05-04: 324 mg via ORAL
  Filled 2012-05-04: qty 4

## 2012-05-04 NOTE — ED Provider Notes (Signed)
History   This chart was scribed for [name] Octave, MD by [name], ED Scribe. This patient was seen in room APA18/APA18 and the patient's care was started at 9:22 PM    CSN: [id number]  Arrival date & time 05/04/12  2111   None     No chief complaint on file.   The history is provided by the patient. No language interpreter was used.   [name] is a 62 y.o. male with h/o CAD and angina who presents to the Emergency Department complaining of returning dizziness after having a near syncopal episode yesterday with associated dizziness at the time but no fall with waxing-and-waning mild left upper arm pain since that lasts 5-10 minutes when present and is not present during exam.  Pt reports h/o similar arm pain prior to having carotid stent placed 2 years ago but has not had a stress test since.  No h/o MI.  Last catheterization 06/2010.  Pt denies chest pain, back pain, HA.  Pt took metoprolol and aspirin this AM but no nitro today.  Plavix only blood thinner.   Pt denies tobacco and alcohol use.  Cardiologist is Dr. Herbie Baltimore. Past Medical History  Diagnosis Date  . Coronary artery disease   . GERD (gastroesophageal reflux disease)   . Angina   . Seizures     in teenage year's    Past Surgical History  Procedure Date  . Carotid stent   . Coronary angioplasty 2012  . Hernia repair     No family history on file.  History  Substance Use Topics  . Smoking status: Never Smoker   . Smokeless tobacco: Not on file  . Alcohol Use: No      Review of Systems A complete 10 system review of systems was obtained and all systems are negative except as noted in the HPI and PMH.   Allergies  Review of patient's allergies indicates no known allergies.  Home Medications   Current Outpatient Rx  Name  Route  Sig  Dispense  Refill  . ASPIRIN EC 81 MG PO TBEC   Oral   Take 162 mg by mouth daily.         Marland Kitchen ESOMEPRAZOLE MAGNESIUM 40 MG PO CPDR   Oral   Take 40 mg by  mouth daily before breakfast.         . METOPROLOL SUCCINATE ER 25 MG PO TB24   Oral   Take 25 mg by mouth daily.         Marland Kitchen NITROGLYCERIN 0.4 MG SL SUBL   Sublingual   Place 0.4 mg under the tongue every 5 (five) minutes as needed. For chest pain         . PRASUGREL HCL 10 MG PO TABS   Oral   Take 10 mg by mouth daily.          Marland Kitchen PRAVASTATIN SODIUM 40 MG PO TABS   Oral   Take 40 mg by mouth at bedtime.         Marland Kitchen RAMIPRIL 5 MG PO CAPS   Oral   Take 5 mg by mouth at bedtime.            BP 142/87  Pulse 84  Temp 97.8 F (36.6 C) (Oral)  Resp 16  Ht 5\' 8"  (1.727 m)  Wt 175 lb (79.379 kg)  BMI 26.61 kg/m2  SpO2 99%  Physical Exam  Nursing note and vitals reviewed. Constitutional: He is oriented to  person, place, and time. He appears well-developed and well-nourished. No distress.  HENT:  Head: Normocephalic and atraumatic.  Eyes: Conjunctivae normal are normal.  Neck: Neck supple. No tracheal deviation present.  Cardiovascular: Normal rate, regular rhythm and normal heart sounds.   No murmur heard.      Intact distal pulses  Pulmonary/Chest: Effort normal and breath sounds normal. No respiratory distress. He has no wheezes.  Abdominal: Soft. Bowel sounds are normal. There is no tenderness.  Musculoskeletal: Normal range of motion.  Neurological: He is alert and oriented to person, place, and time.       5/5 strength   Skin: Skin is warm and dry.  Psychiatric: He has a normal mood and affect. His behavior is normal.    ED Course  Procedures (including critical care time) DIAGNOSTIC STUDIES: Oxygen Saturation is 99% on [place], normal by my interpretation.    COORDINATION OF CARE: 9:29 PM- Patient informed of clinical course, understands medical decision-making process, and agrees with plan.  Results for orders placed during the hospital encounter of 05/04/12  CBC WITH DIFFERENTIAL      Component Value Range   WBC 7.0  4.0 - 10.5 K/uL   RBC 4.91  4.22  - 5.81 MIL/uL   Hemoglobin 14.8  13.0 - 17.0 g/dL   HCT 16.1  09.6 - 04.5 %   MCV 85.7  78.0 - 100.0 fL   MCH 30.1  26.0 - 34.0 pg   MCHC 35.2  30.0 - 36.0 g/dL   RDW 40.9  81.1 - 91.4 %   Platelets 203  150 - 400 K/uL   Neutrophils Relative 61  43 - 77 %   Neutro Abs 4.3  1.7 - 7.7 K/uL   Lymphocytes Relative 23  12 - 46 %   Lymphs Abs 1.6  0.7 - 4.0 K/uL   Monocytes Relative 9  3 - 12 %   Monocytes Absolute 0.7  0.1 - 1.0 K/uL   Eosinophils Relative 6 (*) 0 - 5 %   Eosinophils Absolute 0.4  0.0 - 0.7 K/uL   Basophils Relative 1  0 - 1 %   Basophils Absolute 0.0  0.0 - 0.1 K/uL  COMPREHENSIVE METABOLIC PANEL      Component Value Range   Sodium 135  135 - 145 mEq/L   Potassium 3.7  3.5 - 5.1 mEq/L   Chloride 100  96 - 112 mEq/L   CO2 26  19 - 32 mEq/L   Glucose, Bld 153 (*) 70 - 99 mg/dL   BUN 19  6 - 23 mg/dL   Creatinine, Ser 7.82  0.50 - 1.35 mg/dL   Calcium 9.5  8.4 - 95.6 mg/dL   Total Protein 7.3  6.0 - 8.3 g/dL   Albumin 4.0  3.5 - 5.2 g/dL   AST 19  0 - 37 U/L   ALT 20  0 - 53 U/L   Alkaline Phosphatase 66  39 - 117 U/L   Total Bilirubin 0.7  0.3 - 1.2 mg/dL   GFR calc non Af Amer 86 (*) >90 mL/min   GFR calc Af Amer >90  >90 mL/min  TROPONIN I      Component Value Range   Troponin I <0.30  <0.30 ng/mL  D-DIMER, QUANTITATIVE      Component Value Range   D-Dimer, Quant <0.27  0.00 - 0.48 ug/mL-FEU    Dg Chest 2 View  05/04/2012  *RADIOLOGY REPORT*  Clinical Data: Left arm pain, chest  pain, dizziness, weakness, history coronary stenting 22 months ago, hypertension  CHEST - 2 VIEW  Comparison: 11/26/2011  Findings: Coronary arterial stent noted. Normal heart size, mediastinal contours, and pulmonary vascularity. Lungs clear. No pleural effusion or pneumothorax. No acute osseous findings.  IMPRESSION: No acute abnormalities.   Original Report Authenticated By: [name], M.D.      No diagnosis found.    MDM  Episode of left arm pain lasting for 15 minutes  associated with dizziness and near syncope. No chest pain, nausea or vomiting. Some shortness of breath. Similar episode yesterday while driving. Had similar left arm pain previous to last placement 2 years ago  Per last cardiology note: cath in Feb 2012 with a LAD lesion treated with a 3.0 x 12 mm drug-eluting stent, post dilated to 3.25 mm in the mid LAD. Thirty percent circumflex was also noted with an EF > 60% on a re-look cath(07/2010) showed widely patent stent  Labs unremarkable.  EKG unchanged and nonischemic.  No weakness on exam. Angina considered, arm pain similar to previous. Unable to reach [facility] cardiology. Will admit for observation and cycling of enzymes.   Date: 05/04/2012  Rate: 83  Rhythm: normal sinus rhythm  QRS Axis: normal  Intervals: normal  ST/T Wave abnormalities: normal  Conduction Disutrbances:none  Narrative Interpretation:   Old EKG Reviewed: unchanged     I personally performed the services described in this documentation, which was scribed in my presence. The recorded information has been reviewed and is accurate.         [name] Octave, MD 05/05/12 [phone]

## 2012-05-04 NOTE — ED Notes (Signed)
Hospitalist in room at this time.  

## 2012-05-04 NOTE — ED Notes (Signed)
Pt has been feeling weak and dizzy yesterday and had an episode of near syncope while driving.  Today, pt is having pain in left arm above elbow.

## 2012-05-04 NOTE — H&P (Signed)
[facility] History and Physical  MAKOA SATZ AVW:[id number] DOB: 01-20-1951 DOA: 05/04/2012   PCP: Ulanda Edison, MD  Specialists: Cardiology: Dr. Herbie Baltimore with [facility] and vascular  Chief Complaint: Almost passed out  HPI: [name] is a 62 y.o. male with a past medical history of coronary artery disease with stent to the LAD placed in February of 2012. Patient actually had a repeat cardiac catheterization in March of 2012 for atypical chest pain, which did not show any new lesions. Patient also has a history of hypertension acid reflux disease, and hypercholesterolemia. He tells me that in the spring of 2013 he went to see his cardiologist for near syncopal episodes. He wore a heart monitor for a month, but no clear etiology was found. He tells me that he was in his usual state of health till yesterday morning when he was not doing any exertion, and felt like he was going to pass out. He experienced some numbness in the left arm. The symptoms subsided and he felt okay this morning. However, this afternoon he was reaching up to collect something when he again felt swimmy headed. He, again had numbness in the left arm and felt both his legs to be numb. However, denies any focal weakness or seizure type activity. Denies any chest pain. Denies any shortness of breath that is more than usual. Denies any nausea, vomiting. No cough. No diarrhea. No bowel pain. No history of fever or chills. He did feel a bit hot this morning. Denies taking any medications for his symptoms. He did check his blood sugar which was 119. He tells me that his grandchildren have been sick with the stomach issues recently. The numbness lasted about 10 minutes and then resolved. However, because he's been having the symptoms for the last 48 hours his family asked him to come in to be checked out. Currently, patient is symptom-free. He has not seen his cardiologist since spring of 2013. Doesn't have an  appointment in near future.   Home Medications: Prior to Admission medications   Medication Sig Start Date End Date Taking? Authorizing Provider  aspirin EC 81 MG tablet Take 162 mg by mouth every morning.    Yes Historical Provider, MD  clopidogrel (PLAVIX) 75 MG tablet Take 75 mg by mouth every morning.   Yes Historical Provider, MD  metoprolol succinate (TOPROL-XL) 25 MG 24 hr tablet Take 25 mg by mouth every morning.    Yes Historical Provider, MD  OVER THE COUNTER MEDICATION Take 1 tablet by mouth every morning. VITAMIN SUPPLEMENT THAT PROMOTES CIRCULATION: NAME IS UNKNOWN   Yes Historical Provider, MD  pantoprazole (PROTONIX) 40 MG tablet Take 40 mg by mouth 2 (two) times daily.   Yes Historical Provider, MD  pravastatin (PRAVACHOL) 80 MG tablet Take 80 mg by mouth at bedtime.   Yes Historical Provider, MD  ramipril (ALTACE) 5 MG capsule Take 5 mg by mouth at bedtime.    Yes Historical Provider, MD  nitroGLYCERIN (NITROSTAT) 0.4 MG SL tablet Place 0.4 mg under the tongue every 5 (five) minutes as needed. For chest pain    Historical Provider, MD    Allergies: No Known Allergies  Past Medical History: Past Medical History  Diagnosis Date  . Coronary artery disease   . GERD (gastroesophageal reflux disease)   . Angina   . Seizures     in teenage year's    Past Surgical History  Procedure Date  . Carotid stent   . Coronary angioplasty  2012  . Hernia repair     Social History:  reports that he has never smoked. He does not have any smokeless tobacco history on file. He reports that he does not drink alcohol or use illicit drugs.  Living Situation: He [place] in the long with his wife Activity Level: Usually independent in daily activities   Family History:  Family History  Problem Relation Age of Onset  . Heart disease Mother   . Heart attack Father   . Heart attack Brother     Review of Systems - History obtained from the patient General ROS: negative Psychological  ROS: negative Ophthalmic ROS: negative ENT ROS: negative Allergy and Immunology ROS: negative Hematological and Lymphatic ROS: negative Endocrine ROS: negative Respiratory ROS: as in hpi Cardiovascular ROS: as in hpi Gastrointestinal ROS: no abdominal pain, change in bowel habits, or black or bloody stools Genito-Urinary ROS: no dysuria, trouble voiding, or hematuria Musculoskeletal ROS: negative Neurological ROS: no TIA or stroke symptoms Dermatological ROS: negative  Physical Examination  Filed Vitals:   05/04/12 2120 05/04/12 2133  BP: 142/87 137/78  Pulse: 84 78  Temp: 97.8 F (36.6 C)   TempSrc: Oral   Resp: 16 12  Height: 5\' 8"  (1.727 m)   Weight: 79.379 kg (175 lb)   SpO2: 99% 99%    General appearance: alert, cooperative, appears stated age and no distress Head: Normocephalic, without obvious abnormality, atraumatic Eyes: conjunctivae/corneas clear. PERRL, EOM's intact.  Throat: lips, mucosa, and tongue normal; teeth and gums normal Neck: no adenopathy, no carotid bruit, no JVD, supple, symmetrical, trachea midline and thyroid not enlarged, symmetric, no tenderness/mass/nodules Back: symmetric, no curvature. ROM normal. No CVA tenderness. Resp: clear to auscultation bilaterally Cardio: regular rate and rhythm, S1, S2 normal, no murmur, click, rub or gallop GI: soft, non-tender; bowel sounds normal; no masses,  no organomegaly Extremities: extremities normal, atraumatic, no cyanosis or edema Pulses: 2+ and symmetric Skin: Skin color, texture, turgor normal. No rashes or lesions Lymph nodes: Cervical, supraclavicular, and axillary nodes normal. Neurologic: Alert and oriented x3. No focal neurological deficits are present. Cranial nerves are intact. Motor strength is equal bilaterally  Laboratory Data: Results for orders placed during the hospital encounter of 05/04/12 (from the past 48 hour(s))  CBC WITH DIFFERENTIAL     Status: Abnormal   Collection Time    05/04/12  9:23 PM      Component Value Range Comment   WBC 7.0  4.0 - 10.5 K/uL    RBC 4.91  4.22 - 5.81 MIL/uL    Hemoglobin 14.8  13.0 - 17.0 g/dL    HCT 11.9  14.7 - 82.9 %    MCV 85.7  78.0 - 100.0 fL    MCH 30.1  26.0 - 34.0 pg    MCHC 35.2  30.0 - 36.0 g/dL    RDW 56.2  13.0 - 86.5 %    Platelets 203  150 - 400 K/uL    Neutrophils Relative 61  43 - 77 %    Neutro Abs 4.3  1.7 - 7.7 K/uL    Lymphocytes Relative 23  12 - 46 %    Lymphs Abs 1.6  0.7 - 4.0 K/uL    Monocytes Relative 9  3 - 12 %    Monocytes Absolute 0.7  0.1 - 1.0 K/uL    Eosinophils Relative 6 (*) 0 - 5 %    Eosinophils Absolute 0.4  0.0 - 0.7 K/uL    Basophils Relative 1  0 - 1 %    Basophils Absolute 0.0  0.0 - 0.1 K/uL   COMPREHENSIVE METABOLIC PANEL     Status: Abnormal   Collection Time   05/04/12  9:23 PM      Component Value Range Comment   Sodium 135  135 - 145 mEq/L    Potassium 3.7  3.5 - 5.1 mEq/L    Chloride 100  96 - 112 mEq/L    CO2 26  19 - 32 mEq/L    Glucose, Bld 153 (*) 70 - 99 mg/dL    BUN 19  6 - 23 mg/dL    Creatinine, Ser 1.61  0.50 - 1.35 mg/dL    Calcium 9.5  8.4 - 09.6 mg/dL    Total Protein 7.3  6.0 - 8.3 g/dL    Albumin 4.0  3.5 - 5.2 g/dL    AST 19  0 - 37 U/L    ALT 20  0 - 53 U/L    Alkaline Phosphatase 66  39 - 117 U/L    Total Bilirubin 0.7  0.3 - 1.2 mg/dL    GFR calc non Af Amer 86 (*) >90 mL/min    GFR calc Af Amer >90  >90 mL/min   TROPONIN I     Status: Normal   Collection Time   05/04/12  9:23 PM      Component Value Range Comment   Troponin I <0.30  <0.30 ng/mL   D-DIMER, QUANTITATIVE     Status: Normal   Collection Time   05/04/12  9:24 PM      Component Value Range Comment   D-Dimer, Quant <0.27  0.00 - 0.48 ug/mL-FEU     Radiology Reports: Dg Chest 2 View  05/04/2012  *RADIOLOGY REPORT*  Clinical Data: Left arm pain, chest pain, dizziness, weakness, history coronary stenting 22 months ago, hypertension  CHEST - 2 VIEW  Comparison: 11/26/2011  Findings:  Coronary arterial stent noted. Normal heart size, mediastinal contours, and pulmonary vascularity. Lungs clear. No pleural effusion or pneumothorax. No acute osseous findings.  IMPRESSION: No acute abnormalities.   Original Report Authenticated By: [name], M.D.     Electrocardiogram: Sinus rhythm at 83 beats per minute. Normal axis. Intervals appear to be normal. No Q waves. No concerning ST or T-wave changes are noted. EKG is similar to one from December of 2012.  Problem List  Principal Problem:  *Near syncope Active Problems:  CAD (coronary artery disease): Cath April 2012 with  DES to LAD  HTN (hypertension)  HLD (hyperlipidemia)  History of PSVT (paroxysmal supraventricular tachycardia)  Left arm numbness   Assessment: This is a 62 year old, Caucasian male, with a past medical history of coronary artery disease, hypertension, who presents with the near syncopal episode yesterday and today. He also had some left arm numbness. Currently, patient is symptom-free. Differential diagnosis includes Angina, postural Hypotension, arrythmias. Orthostatics will need to be checked. He will need to be monitored on telemetry to rule out arrhythmias. EKG does not show any acute ischemic changes. He does not have any focal neurological deficits at this time. D-dimer is normal.  Plan: #1 near syncope: He'll be monitored on telemetry. Orthostatics will be checked. Echocardiogram will be ordered. He tells me that he has a carotid Doppler within the last 2 years and it did not show any blockages. We will also cycle Troponins.  #2 left arm numbness: Patient is concerned, that this could be related to his heart. We will continue to cycled  troponins. He has good pulses. There is no evidence to suggest a neurological process at this time. Angina is always a possibility. It might be of benefit to have this patient evaluated by cardiologist, while he's hospitalized. Hence, he will need to be transferred to  [facility] for cardiology consultation as no cardiologist is available at [facility].  #3 coronary artery disease: Has a stent to LAD placed in February of 2012. EKG does not show any new ischemic changes. Continue with aspirin, Plavix,  and beta blocker.  Further management decisions will depend on results of further testing and patient's response to treatment.  Code Status: He is a full code Family Communication: Discussed with his wife at the bedside  Disposition Plan: Will likely return home when improved   [facility]  [facility] Pager [phone]  If 7PM-7AM, please contact night-coverage [email or web address] Password [facility]  05/04/2012, 11:17 PM

## 2012-05-04 NOTE — ED Notes (Signed)
Pt reports left arm pain the has subsided at this time. Pt states he was lightheaded earlier tonight while in [employer]. Pt had stent placed in feb 2 years ago. Has been dizzy on & off for the past few days.

## 2012-05-05 ENCOUNTER — Encounter (HOSPITAL_COMMUNITY): Payer: Self-pay | Admitting: Cardiology

## 2012-05-05 DIAGNOSIS — M79609 Pain in unspecified limb: Secondary | ICD-10-CM

## 2012-05-05 LAB — CBC
MCHC: 34.1 g/dL (ref 30.0–36.0)
Platelets: 195 10*3/uL (ref 150–400)
RDW: 12.2 % (ref 11.5–15.5)
WBC: 5.1 10*3/uL (ref 4.0–10.5)

## 2012-05-05 LAB — COMPREHENSIVE METABOLIC PANEL
ALT: 18 U/L (ref 0–53)
Alkaline Phosphatase: 67 U/L (ref 39–117)
BUN: 17 mg/dL (ref 6–23)
CO2: 26 mEq/L (ref 19–32)
Chloride: 104 mEq/L (ref 96–112)
GFR calc Af Amer: >90 mL/min (ref >90)
GFR calc non Af Amer: 86 mL/min — ABNORMAL LOW (ref >90)
Glucose, Bld: 107 mg/dL — ABNORMAL HIGH (ref 70–99)
Potassium: 3.9 mEq/L (ref 3.5–5.1)
Sodium: 140 mEq/L (ref 135–145)
Total Bilirubin: 0.5 mg/dL (ref 0.3–1.2)
Total Protein: 6.8 g/dL (ref 6.0–8.3)

## 2012-05-05 LAB — TROPONIN I
Troponin I: <0.3 ng/mL (ref <0.30)
Troponin I: <0.3 ng/mL (ref <0.30)

## 2012-05-05 LAB — MRSA PCR SCREENING: MRSA by PCR: NEGATIVE

## 2012-05-05 MED ORDER — METOPROLOL SUCCINATE ER 25 MG PO TB24
25.0000 mg | ORAL_TABLET | Freq: Every morning | ORAL | Status: DC
  Administered 2012-05-05: 25 mg via ORAL
  Filled 2012-05-05: qty 1

## 2012-05-05 MED ORDER — ACETAMINOPHEN 325 MG PO TABS: 650.0000 mg | ORAL_TABLET | Freq: Four times a day (QID) | ORAL | Status: DC | PRN

## 2012-05-05 MED ORDER — ASPIRIN EC 81 MG PO TBEC
162.0000 mg | DELAYED_RELEASE_TABLET | Freq: Every morning | ORAL | Status: DC
  Administered 2012-05-05: 162 mg via ORAL
  Filled 2012-05-05: qty 2

## 2012-05-05 MED ORDER — ENOXAPARIN SODIUM 40 MG/0.4ML ~~LOC~~ SOLN
40.0000 mg | SUBCUTANEOUS | Status: DC
  Administered 2012-05-05: 40 mg via SUBCUTANEOUS
  Filled 2012-05-05 (×2): qty 0.4

## 2012-05-05 MED ORDER — ALBUTEROL SULFATE (5 MG/ML) 0.5% IN NEBU: 2.5000 mg | INHALATION_SOLUTION | RESPIRATORY_TRACT | Status: DC | PRN

## 2012-05-05 MED ORDER — CLOPIDOGREL BISULFATE 75 MG PO TABS
75.0000 mg | ORAL_TABLET | Freq: Every morning | ORAL | Status: DC
  Administered 2012-05-05: 75 mg via ORAL
  Filled 2012-05-05: qty 1

## 2012-05-05 MED ORDER — RAMIPRIL 5 MG PO CAPS
5.0000 mg | ORAL_CAPSULE | Freq: Every day | ORAL | Status: DC
  Filled 2012-05-05: qty 1

## 2012-05-05 MED ORDER — SIMVASTATIN 40 MG PO TABS
40.0000 mg | ORAL_TABLET | Freq: Every day | ORAL | Status: DC
  Filled 2012-05-05: qty 1

## 2012-05-05 MED ORDER — NITROGLYCERIN 0.4 MG SL SUBL: 0.4000 mg | SUBLINGUAL_TABLET | SUBLINGUAL | Status: DC | PRN

## 2012-05-05 MED ORDER — PANTOPRAZOLE SODIUM 40 MG PO TBEC
40.0000 mg | DELAYED_RELEASE_TABLET | Freq: Two times a day (BID) | ORAL | Status: DC
  Administered 2012-05-05: 40 mg via ORAL
  Filled 2012-05-05 (×2): qty 1

## 2012-05-05 MED ORDER — ACETAMINOPHEN 650 MG RE SUPP: 650.0000 mg | Freq: Four times a day (QID) | RECTAL | Status: DC | PRN

## 2012-05-05 MED ORDER — SODIUM CHLORIDE 0.9 % IJ SOLN: 3.0000 mL | Freq: Two times a day (BID) | INTRAMUSCULAR | Status: DC

## 2012-05-05 MED ORDER — ONDANSETRON HCL 4 MG/2ML IJ SOLN: 4.0000 mg | Freq: Four times a day (QID) | INTRAMUSCULAR | Status: DC | PRN

## 2012-05-05 MED ORDER — ONDANSETRON HCL 4 MG PO TABS: 4.0000 mg | ORAL_TABLET | Freq: Four times a day (QID) | ORAL | Status: DC | PRN

## 2012-05-05 MED ORDER — SODIUM CHLORIDE 0.9 % IV SOLN
INTRAVENOUS | Status: AC
  Administered 2012-05-05: 02:00:00 via INTRAVENOUS

## 2012-05-05 NOTE — Plan of Care (Signed)
Problem: Phase I Progression Outcomes Goal: Aspirin unless contraindicated Outcome: Completed/Met Date Met:  05/05/12 Given at [facility] ED

## 2012-05-05 NOTE — Discharge Summary (Signed)
Physician Discharge Summary  Patient ID: [name] MRN: [id number] DOB/AGE: 1951-04-05 62 y.o.  Admit date: 05/04/2012 Discharge date: 05/05/2012  Primary Care Physician:  [name] Edison, MD   Discharge Diagnoses:    Principal Problem:  *Near syncope Active Problems:  CAD (coronary artery disease): Cath April 2012 with  DES to LAD  HTN (hypertension)  HLD (hyperlipidemia)  History of PSVT (paroxysmal supraventricular tachycardia)  Left arm numbness      Medication List     As of 05/05/2012  1:20 PM    STOP taking these medications         OVER THE COUNTER MEDICATION      TAKE these medications         aspirin EC 81 MG tablet   Take 162 mg by mouth every morning.      clopidogrel 75 MG tablet   Commonly known as: PLAVIX   Take 75 mg by mouth every morning.      metoprolol succinate 25 MG 24 hr tablet   Commonly known as: TOPROL-XL   Take 25 mg by mouth every morning.      nitroGLYCERIN 0.4 MG SL tablet   Commonly known as: NITROSTAT   Place 0.4 mg under the tongue every 5 (five) minutes as needed. For chest pain      pantoprazole 40 MG tablet   Commonly known as: PROTONIX   Take 40 mg by mouth 2 (two) times daily.      pravastatin 80 MG tablet   Commonly known as: PRAVACHOL   Take 80 mg by mouth at bedtime.      ramipril 5 MG capsule   Commonly known as: ALTACE   Take 5 mg by mouth at bedtime.         Disposition and Follow-up:  Will be discharged home today in stable condition. Will follow up with cardiology next week for an ischemic evaluation and possibly implantation of a loop recorder.  Consults:  Cardiology, Dr. Royann Shivers   Significant Diagnostic Studies:  Dg Chest 2 View  05/04/2012  *RADIOLOGY REPORT*  Clinical Data: Left arm pain, chest pain, dizziness, weakness, history coronary stenting 22 months ago, hypertension  CHEST - 2 VIEW  Comparison: 11/26/2011  Findings: Coronary arterial stent noted. Normal heart size, mediastinal contours,  and pulmonary vascularity. Lungs clear. No pleural effusion or pneumothorax. No acute osseous findings.  IMPRESSION: No acute abnormalities.   Original Report Authenticated By: [name], M.D.     Brief H and P: For complete details please refer to admission H and P, but in brief patient is a 62 y.o. male with a past medical history of coronary artery disease with stent to the LAD placed in February of 2012. Patient actually had a repeat cardiac catheterization in March of 2012 for atypical chest pain, which did not show any new lesions. Patient also has a history of hypertension acid reflux disease, and hypercholesterolemia. He tells me that in the spring of 2013 he went to see his cardiologist for near syncopal episodes. He wore a heart monitor for a month, but no clear etiology was found. He tells me that he was in his usual state of health till yesterday morning when he was not doing any exertion, and felt like he was going to pass out. He experienced some numbness in the left arm. The symptoms subsided and he felt okay this morning. However, this afternoon he was reaching up to collect something when he again felt swimmy headed. He, again  had numbness in the left arm and felt both his legs to be numb. However, denies any focal weakness or seizure type activity. Denies any chest pain. Denies any shortness of breath that is more than usual. Denies any nausea, vomiting. No cough. No diarrhea. No bowel pain. No history of fever or chills. He did feel a bit hot this morning. Denies taking any medications for his symptoms. He did check his blood sugar which was 119. He tells me that his grandchildren have been sick with the stomach issues recently. The numbness lasted about 10 minutes and then resolved. However, because he's been having the symptoms for the last 48 hours his family asked him to come in to be checked out. Currently, patient is symptom-free. He has not seen his cardiologist since spring of 2013.  Doesn't have an appointment in near future. We were asked to admit him for further evaluation and management.     Hospital Course:  Principal Problem:  *Near syncope Active Problems:  CAD (coronary artery disease): Cath April 2012 with  DES to LAD  HTN (hypertension)  HLD (hyperlipidemia)  History of PSVT (paroxysmal supraventricular tachycardia)  Left arm numbness   Near Syncope -This has been a recurrent issue for him. -Has ruled out for ACS. -Sounds suspicious for an arrhythmia. -Has been seen by cardiology who is planning an ischemic evaluation and implantation of a loop recorder as an outpatient. 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 -Has been provided follow up information from the cardiology service.  Time spent on Discharge: Greater than 30 minutes.  SignedChaya Jan [facility] Pager: [phone] 05/05/2012, 1:20 PM

## 2012-05-05 NOTE — Progress Notes (Signed)
Dr. Ardyth Harps paged at this time; pt wanting to d/c home; will await callback.

## 2012-05-05 NOTE — Progress Notes (Signed)
  Echocardiogram 2D Echocardiogram has been performed.  [name] 05/05/2012, 11:30 AM

## 2012-05-05 NOTE — Progress Notes (Signed)
Pt given d/c instructions; pt verbalized understanding; pt awaiting ride to arrive; tele and IV removed at this time; will cont. To monitor.

## 2012-05-05 NOTE — Consult Note (Addendum)
Reason for Consult: near syncope  Referring Physician:  Dr. Rito Ehrlich  With [facility]  [name] is an 62 y.o. male.    Chief Complaint: admitted 05/04/12 with complaint of almost passing out.   HPI: [name] is a 62 y.o. male with a past medical history of coronary artery disease with stent to the LAD placed in February of 2012, previous PCI in 2005. Patient actually had a repeat cardiac catheterization in March of 2012 for atypical chest pain, which did not show any new lesions. Patient also has a history of hypertension acid reflux disease, and hypercholesterolemia. He tells me that in the spring of 2013 he went to see his cardiologist for near syncopal episodes. Dr. Herbie Baltimore felt these episodes sounded like heat prostration.  He wore a heart monitor for a month, showing SR and an occ. ST but no arrhythmias.  He did not have symptoms while wearing the monitor.    He tells me that he was in his usual state of health till Thursday morning when he was not doing any exertion, and felt like he was going to pass out. He was driving his car felt a fullness in his throat and then into his head.  He felt like he was going to go out so he put on breaks and was going really slow, he believes he may have actually gone out for a few seconds.  He was not aware of any rapid heart rate.  No chest pain.   He had a friend check his glucose and it was 119, and BP of 135/82 with pulse of 82.    He experienced some numbness in the left arm. The symptoms subsided and he felt okay the morning of admit. However, this afternoon he was reaching up to collect something when he again felt swimmy headed. He, again had numbness in the left arm and felt both his legs to be numb. However, denies any focal weakness or seizure type activity. Denies any chest pain. Denies any shortness of breath that is more than usual. Denies any nausea, vomiting. No cough. No diarrhea. No bowel pain. No history of fever or chills. He did feel a  bit hot this morning. Denies taking any medications for his symptoms. He did check his blood sugar which was 119. He tells me that his grandchildren have been sick with the stomach issues recently. The numbness lasted about 10 minutes and then resolved. He was admitted for further eval. He denied any nausea, vomiting or diaphoresis or SOB.   Other cardiac history Echo in 2010 normal LV size, function.   Myoview stress test 12/12 diaphragmatic attenuation but no ischemia.  Normal EF.   Other hx of PSVT but no recent episodes, also hx. Of borderline diabetes.    Troponin so far is negative.  Ddimer is negative,  EKG SR no acute changes.  No ortho static changes in BP Lying 139/75, standing 133/73.  Past Medical History  Diagnosis Date  . Coronary artery disease   . GERD (gastroesophageal reflux disease)   . Angina   . Seizures     in teenage year's    Past Surgical History  Procedure Date  . Carotid stent   . Coronary angioplasty 2012  . Hernia repair     Family History  Problem Relation Age of Onset  . Heart disease Mother   . Heart attack Father   . Heart attack Brother    Social History:  reports that he has  never smoked. He does not have any smokeless tobacco history on file. He reports that he does not drink alcohol or use illicit drugs.  Allergies: No Known Allergies  Medications Prior to Admission  Medication Sig Dispense Refill  . aspirin EC 81 MG tablet Take 162 mg by mouth every morning.       . clopidogrel (PLAVIX) 75 MG tablet Take 75 mg by mouth every morning.      . metoprolol succinate (TOPROL-XL) 25 MG 24 hr tablet Take 25 mg by mouth every morning.       Marland Kitchen OVER THE COUNTER MEDICATION Take 1 tablet by mouth every morning. VITAMIN SUPPLEMENT THAT PROMOTES CIRCULATION: NAME IS UNKNOWN      . pantoprazole (PROTONIX) 40 MG tablet Take 40 mg by mouth 2 (two) times daily.      . pravastatin (PRAVACHOL) 80 MG tablet Take 80 mg by mouth at bedtime.      . ramipril  (ALTACE) 5 MG capsule Take 5 mg by mouth at bedtime.       . nitroGLYCERIN (NITROSTAT) 0.4 MG SL tablet Place 0.4 mg under the tongue every 5 (five) minutes as needed. For chest pain        Results for orders placed during the hospital encounter of 05/04/12 (from the past 48 hour(s))  CBC WITH DIFFERENTIAL     Status: Abnormal   Collection Time   05/04/12  9:23 PM      Component Value Range Comment   WBC 7.0  4.0 - 10.5 K/uL    RBC 4.91  4.22 - 5.81 MIL/uL    Hemoglobin 14.8  13.0 - 17.0 g/dL    HCT 96.0  45.4 - 09.8 %    MCV 85.7  78.0 - 100.0 fL    MCH 30.1  26.0 - 34.0 pg    MCHC 35.2  30.0 - 36.0 g/dL    RDW 11.9  14.7 - 82.9 %    Platelets 203  150 - 400 K/uL    Neutrophils Relative 61  43 - 77 %    Neutro Abs 4.3  1.7 - 7.7 K/uL    Lymphocytes Relative 23  12 - 46 %    Lymphs Abs 1.6  0.7 - 4.0 K/uL    Monocytes Relative 9  3 - 12 %    Monocytes Absolute 0.7  0.1 - 1.0 K/uL    Eosinophils Relative 6 (*) 0 - 5 %    Eosinophils Absolute 0.4  0.0 - 0.7 K/uL    Basophils Relative 1  0 - 1 %    Basophils Absolute 0.0  0.0 - 0.1 K/uL   COMPREHENSIVE METABOLIC PANEL     Status: Abnormal   Collection Time   05/04/12  9:23 PM      Component Value Range Comment   Sodium 135  135 - 145 mEq/L    Potassium 3.7  3.5 - 5.1 mEq/L    Chloride 100  96 - 112 mEq/L    CO2 26  19 - 32 mEq/L    Glucose, Bld 153 (*) 70 - 99 mg/dL    BUN 19  6 - 23 mg/dL    Creatinine, Ser 5.62  0.50 - 1.35 mg/dL    Calcium 9.5  8.4 - 13.0 mg/dL    Total Protein 7.3  6.0 - 8.3 g/dL    Albumin 4.0  3.5 - 5.2 g/dL    AST 19  0 - 37 U/L    ALT  20  0 - 53 U/L    Alkaline Phosphatase 66  39 - 117 U/L    Total Bilirubin 0.7  0.3 - 1.2 mg/dL    GFR calc non Af Amer 86 (*) >90 mL/min    GFR calc Af Amer >90  >90 mL/min   TROPONIN I     Status: Normal   Collection Time   05/04/12  9:23 PM      Component Value Range Comment   Troponin I <0.30  <0.30 ng/mL   D-DIMER, QUANTITATIVE     Status: Normal   Collection  Time   05/04/12  9:24 PM      Component Value Range Comment   D-Dimer, Quant <0.27  0.00 - 0.48 ug/mL-FEU   TROPONIN I     Status: Normal   Collection Time   05/05/12  1:07 AM      Component Value Range Comment   Troponin I <0.30  <0.30 ng/mL   COMPREHENSIVE METABOLIC PANEL     Status: Abnormal   Collection Time   05/05/12  5:00 AM      Component Value Range Comment   Sodium 140  135 - 145 mEq/L    Potassium 3.9  3.5 - 5.1 mEq/L    Chloride 104  96 - 112 mEq/L    CO2 26  19 - 32 mEq/L    Glucose, Bld 107 (*) 70 - 99 mg/dL    BUN 17  6 - 23 mg/dL    Creatinine, Ser 3.47  0.50 - 1.35 mg/dL    Calcium 9.1  8.4 - 42.5 mg/dL    Total Protein 6.8  6.0 - 8.3 g/dL    Albumin 3.6  3.5 - 5.2 g/dL    AST 19  0 - 37 U/L    ALT 18  0 - 53 U/L    Alkaline Phosphatase 67  39 - 117 U/L    Total Bilirubin 0.5  0.3 - 1.2 mg/dL    GFR calc non Af Amer 86 (*) >90 mL/min    GFR calc Af Amer >90  >90 mL/min   CBC     Status: Normal   Collection Time   05/05/12  5:00 AM      Component Value Range Comment   WBC 5.1  4.0 - 10.5 K/uL    RBC 4.85  4.22 - 5.81 MIL/uL    Hemoglobin 14.2  13.0 - 17.0 g/dL    HCT 95.6  38.7 - 56.4 %    MCV 86.0  78.0 - 100.0 fL    MCH 29.3  26.0 - 34.0 pg    MCHC 34.1  30.0 - 36.0 g/dL    RDW 33.2  95.1 - 88.4 %    Platelets 195  150 - 400 K/uL   MRSA PCR SCREENING     Status: Normal   Collection Time   05/05/12  6:20 AM      Component Value Range Comment   MRSA by PCR NEGATIVE  NEGATIVE    Dg Chest 2 View  05/04/2012  *RADIOLOGY REPORT*  Clinical Data: Left arm pain, chest pain, dizziness, weakness, history coronary stenting 22 months ago, hypertension  CHEST - 2 VIEW  Comparison: 11/26/2011  Findings: Coronary arterial stent noted. Normal heart size, mediastinal contours, and pulmonary vascularity. Lungs clear. No pleural effusion or pneumothorax. No acute osseous findings.  IMPRESSION: No acute abnormalities.   Original Report Authenticated By: [name], M.D.     ROS:  General:no  colds or fevers, no weight changes Skin:no rashes or ulcers HEENT:no blurred vision, no congestion CV:see HPI PUL:see HPI GI:no diarrhea constipation or melena, + reflux symptoms that are chronic GU:no hematuria, no dysuria MS:no joint pain, no claudication Neuro:+ near syncope, + lt arm numbness no lightheadedness see HPI Endo:borderline diabetes though fasting glucose on Friday was 96, no thyroid disease   Blood pressure 129/85, pulse 87, temperature 98.3 F (36.8 C), temperature source Oral, resp. rate 12, height 5\' 8"  (1.727 m), weight 79.379 kg (175 lb), SpO2 99.00%. PE: General:alert and oriented, pleasant affect Skin:warm and dry, brisk capillary refill HEENT:normocephalic, sclera clear, Toupe in place  Neck:supple no JVD, no carotid bruits  Heart:S1S2 RRR no obvious murmur Lungs:clear without rales or rhonchi Abd:+ BS, soft, non tender Ext:no edema, 2+ pedal pulses Neuro:alert and oriented, pleasant affect    Assessment/Plan Principal Problem:  *Near syncope Active Problems:  CAD (coronary artery disease): Cath April 2012 with  DES to LAD  HTN (hypertension)  HLD (hyperlipidemia)  History of PSVT (paroxysmal supraventricular tachycardia)  Left arm numbness  PLAN: ? Need for loop recorder.  Pt was asymptomatic while wearing monitor from office.  Pt would like to go home this AM and return for any testing or procedure.  He has chickens and the auto feeder is not working approp.   He has a 2 D Echo ordered for this AM.  Outpatient Nuc for follow up.  INGOLD,LAURA R 05/05/2012, 10:16 AM     I have seen and examined the patient along with [facility] R, NP.  I have reviewed the chart, notes and new data.  I agree with NP's note.  Key new complaints: currently fully asymptomatic; recurrent unheralded episodes of presyncope suggest arrhythmia Key examination changes: appears well, normal exam Key new findings / data: echo pending  PLAN: Best approach is  implantable loop recorder. This procedure was fully reviewed with the patient and he agrees to proceed, informed consent has been obtained. Will be done electively next week and we will take care of scheduling it. It is also wise to reevaluate for coronary insufficiency with a stress perfusion scan, also to be performed as an outpatient.  Thurmon Fair, MD, [facility] [phone] 05/05/2012, 11:48 AM

## 2012-05-07 ENCOUNTER — Encounter (HOSPITAL_COMMUNITY): Payer: Self-pay | Admitting: Pharmacy Technician

## 2012-05-07 ENCOUNTER — Other Ambulatory Visit (HOSPITAL_COMMUNITY): Payer: Self-pay | Admitting: Cardiology

## 2012-05-07 DIAGNOSIS — R55 Syncope and collapse: Secondary | ICD-10-CM

## 2012-05-07 DIAGNOSIS — I2510 Atherosclerotic heart disease of native coronary artery without angina pectoris: Secondary | ICD-10-CM

## 2012-05-07 DIAGNOSIS — R079 Chest pain, unspecified: Secondary | ICD-10-CM

## 2012-05-08 ENCOUNTER — Ambulatory Visit (HOSPITAL_COMMUNITY)
Admission: RE | Admit: 2012-05-08 | Discharge: 2012-05-08 | Disposition: A | Discharge status: Home / Self Care | Payer: BC Managed Care – PPO | Source: Ambulatory Visit | Attending: Cardiology | Admitting: Cardiology

## 2012-05-08 DIAGNOSIS — I2510 Atherosclerotic heart disease of native coronary artery without angina pectoris: Secondary | ICD-10-CM

## 2012-05-08 DIAGNOSIS — I10 Essential (primary) hypertension: Secondary | ICD-10-CM | POA: Insufficient documentation

## 2012-05-08 DIAGNOSIS — R55 Syncope and collapse: Secondary | ICD-10-CM

## 2012-05-08 DIAGNOSIS — R079 Chest pain, unspecified: Secondary | ICD-10-CM | POA: Insufficient documentation

## 2012-05-08 MED ORDER — TECHNETIUM TC 99M SESTAMIBI GENERIC - CARDIOLITE
8.7000 | Freq: Once | INTRAVENOUS | Status: AC | PRN
  Administered 2012-05-08: 9 via INTRAVENOUS

## 2012-05-08 MED ORDER — TECHNETIUM TC 99M SESTAMIBI GENERIC - CARDIOLITE
25.4000 | Freq: Once | INTRAVENOUS | Status: AC | PRN
  Administered 2012-05-08: 25 via INTRAVENOUS

## 2012-05-08 MED ORDER — REGADENOSON 0.4 MG/5ML IV SOLN
0.4000 mg | Freq: Once | INTRAVENOUS | Status: AC
  Administered 2012-05-08: 0.4 mg via INTRAVENOUS

## 2012-05-08 MED ORDER — AMINOPHYLLINE 25 MG/ML IV SOLN
75.0000 mg | Freq: Once | INTRAVENOUS | Status: AC
  Administered 2012-05-08: 75 mg via INTRAVENOUS

## 2012-05-08 NOTE — Procedures (Addendum)
[name] [address] [place] [address] [phone]  Cardiology Nuclear Med Study  [name] is a 62 y.o. male     MRN : [id number]     DOB: December 19, 1950  Procedure Date: 05/08/2012  Nuclear Med Background Indication for Stress Test:  Evaluation for Ischemia History:  CAD Cardiac Risk Factors: History of Smoking, Hypertension and Lipids  Symptoms:  Chest Pain   Nuclear Pre-Procedure Caffeine/Decaff Intake:  1:00am NPO After: 11:00AM   IV Site: R Antecubital  IV 0.9% NS with Angio Cath:  22g  Chest Size (in):  42 IV Started by: Koren Shiver, CNMT  Height: 5\' 8"  (1.727 m)  Cup Size: n/a  BMI:  Body mass index is 26.61 kg/(m^2). Weight:  175 lb (79.379 kg)   Tech Comments:  n/a    Nuclear Med Study 1 or 2 day study: 1 day  Stress Test Type:  Lexiscan  Order Authorizing Provider:  Thurmon Fair, MD   Resting Radionuclide: Technetium 101m Sestamibi  Resting Radionuclide Dose: 8.7 mCi   Stress Radionuclide:  Technetium 65m Sestamibi  Stress Radionuclide Dose: 25.4 mCi           Stress Protocol Rest HR: 67 Stress HR: 109  Rest BP: 130/96 Stress BP: 151/75  Exercise Time (min): n/a METS: n/a          Dose of Adenosine (mg):  n/a Dose of Lexiscan: 0.4 mg  Dose of Atropine (mg): n/a Dose of Dobutamine: n/a mcg/kg/min (at max HR)  Stress Test Technologist: Ernestene Mention, CCT Nuclear Technologist: Koren Shiver, CNMT   Rest Procedure:  Myocardial perfusion imaging was performed at rest 45 minutes following the intravenous administration of Technetium 13m Sestamibi. Stress Procedure:  The patient received IV Lexiscan 0.4 mg over 15-seconds.  Technetium 24m Sestamibi injected at 30-seconds.  Due to patient's shortness of breath and dizziness, he was given 75 mg of aminophylline through his IV. Symptoms were resolved. There were no significant changes with Lexiscan.  Quantitative spect images were obtained  after a 45 minute delay.  Transient Ischemic Dilatation (Normal <1.22):  0.78 Lung/Heart Ratio (Normal <0.45):  0.25 QGS EDV:  67 ml QGS ESV:  26 ml LV Ejection Fraction: 62%      Rest ECG: NSR - Normal EKG  Stress ECG: No significant change from baseline ECG  QPS Raw Data Images:  Normal; no motion artifact; normal heart/lung ratio. Stress Images:  Normal homogeneous uptake in all areas of the myocardium. Rest Images:  Normal homogeneous uptake in all areas of the myocardium. Subtraction (SDS):  No evidence of ischemia.  Impression Exercise Capacity:  Lexiscan with no exercise. BP Response:  Normal blood pressure response. Clinical Symptoms:  No significant symptoms noted. ECG Impression:  No significant ST segment change suggestive of ischemia. Comparison with Prior Nuclear Study: No significant change from previous study  Overall Impression:  Normal stress nuclear study.  LV Wall Motion:  NL LV Function; NL Wall Motion   [name], MD  05/08/2012 5:48 PM

## 2012-05-10 ENCOUNTER — Other Ambulatory Visit: Payer: Self-pay | Admitting: *Deleted

## 2012-05-11 ENCOUNTER — Encounter (HOSPITAL_COMMUNITY)
Admission: RE | Disposition: A | Discharge status: Home / Self Care | Payer: Self-pay | Source: Ambulatory Visit | Attending: Cardiovascular Disease

## 2012-05-11 ENCOUNTER — Ambulatory Visit (HOSPITAL_COMMUNITY)
Admission: RE | Admit: 2012-05-11 | Discharge: 2012-05-11 | Disposition: A | Discharge status: Home / Self Care | Payer: BC Managed Care – PPO | Source: Ambulatory Visit | Attending: Cardiovascular Disease | Admitting: Cardiovascular Disease

## 2012-05-11 ENCOUNTER — Ambulatory Visit (HOSPITAL_COMMUNITY): Admit: 2012-05-11 | Payer: Self-pay | Admitting: Cardiovascular Disease

## 2012-05-11 DIAGNOSIS — R55 Syncope and collapse: Secondary | ICD-10-CM | POA: Insufficient documentation

## 2012-05-11 HISTORY — PX: LOOP RECORDER IMPLANT: SHX5477

## 2012-05-11 LAB — PROTIME-INR: Prothrombin Time: 14.3 seconds (ref 11.6–15.2)

## 2012-05-11 SURGERY — LOOP RECORDER IMPLANT
Anesthesia: LOCAL

## 2012-05-11 MED ORDER — SODIUM CHLORIDE 0.45 % IV SOLN: INTRAVENOUS | Status: DC

## 2012-05-11 MED ORDER — CEFAZOLIN SODIUM-DEXTROSE 2-3 GM-% IV SOLR
INTRAVENOUS | Status: AC
  Filled 2012-05-11: qty 50

## 2012-05-11 MED ORDER — SODIUM CHLORIDE 0.9 % IV SOLN: INTRAVENOUS | Status: DC

## 2012-05-11 MED ORDER — SODIUM CHLORIDE 0.9 % IR SOLN
80.0000 mg | Status: DC
  Filled 2012-05-11: qty 2

## 2012-05-11 MED ORDER — ONDANSETRON HCL 4 MG/2ML IJ SOLN: 4.0000 mg | Freq: Four times a day (QID) | INTRAMUSCULAR | Status: DC | PRN

## 2012-05-11 MED ORDER — MUPIROCIN 2 % EX OINT: TOPICAL_OINTMENT | Freq: Two times a day (BID) | CUTANEOUS | Status: DC

## 2012-05-11 MED ORDER — SODIUM CHLORIDE 0.9 % IJ SOLN: 3.0000 mL | INTRAMUSCULAR | Status: DC | PRN

## 2012-05-11 MED ORDER — ACETAMINOPHEN 325 MG PO TABS: 325.0000 mg | ORAL_TABLET | ORAL | Status: DC | PRN

## 2012-05-11 MED ORDER — MIDAZOLAM HCL 2 MG/2ML IJ SOLN
INTRAMUSCULAR | Status: AC
  Filled 2012-05-11: qty 2

## 2012-05-11 MED ORDER — CHLORHEXIDINE GLUCONATE 4 % EX LIQD: 60.0000 mL | Freq: Once | CUTANEOUS | Status: DC

## 2012-05-11 MED ORDER — MUPIROCIN 2 % EX OINT
TOPICAL_OINTMENT | CUTANEOUS | Status: AC
  Filled 2012-05-11: qty 22

## 2012-05-11 MED ORDER — FENTANYL CITRATE 0.05 MG/ML IJ SOLN
INTRAMUSCULAR | Status: AC
  Filled 2012-05-11: qty 2

## 2012-05-11 MED ORDER — LIDOCAINE HCL (PF) 1 % IJ SOLN
INTRAMUSCULAR | Status: AC
  Filled 2012-05-11: qty 60

## 2012-05-11 MED ORDER — CEFAZOLIN SODIUM-DEXTROSE 2-3 GM-% IV SOLR: 2.0000 g | INTRAVENOUS | Status: DC

## 2012-05-11 NOTE — CV Procedure (Signed)
[name],[name] A Male, 62 y.o., 1950-11-06  MRN: [id number]  CSN: [id number]  Admit Dt: 05/11/12    LOOP RECORDER IMPLANT   Procedure report  Procedure performed:  1. Loop recorder implantation  2. Light sedation  Reason for procedure:  1. Syncope  Procedure performed by:  Thurmon Fair, MD  Complications:  None  Estimated blood loss:  <5 mL  Medications administered during procedure:  Ancef 2 g intravenously,, lidocaine 1% 30 mL locally, fentanyl 50 mcg intravenously, Versed 2 mg intravenously  Device details:  Medtronic Reveal XT model number  [id number], serial number [id number]  Procedure details:  After the risks and benefits of the procedure were discussed the patient provided informed consent. She was brought to the cardiac catheter lab in the fasting state. The patient was prepped and draped in usual sterile fashion. the best location for Loop recording had been established by preprocedure mapping to be in the left parasternal 4th intercostal space. Local anesthesia with 1% lidocaine was administered to to the left infraclavicular area. A 3 cm horizontal incision was made. Using electrocautery and mostly blunt dissection a pocket was created with careful attention to hemostasis. The pocket was flushed with copious amounts of antibiotic solution.  The device was then carefully inserted in the pocket with care so that there would not be pressure on the incision. The pocket was then closed in layers using 2 layers of 2-0 Vicryl and one layer of 4-0 Vicyl after which SteriStrips and a sterile dressing was applied.   Thurmon Fair, MD, [facility] [phone] office [phone] pager 05/11/2012 3:26 PM

## 2012-05-11 NOTE — H&P (Signed)
  Date of Initial H&P: 05/04/2012  History reviewed, patient examined, no change in status, stable for surgery. Here for elective loop recorder implantation for recurrent near syncope. +ve CAD and PCI history, nuclear perfusion study on 05/02/12 was normal. External event monitor non diagnostic (no clinical events while wearing it). This procedure has been fully reviewed with the patient and written informed consent has been obtained. Thurmon Fair, MD, [facility] [phone] office [phone] pager 05/11/2012

## 2012-08-27 ENCOUNTER — Encounter: Payer: Self-pay | Admitting: Cardiology

## 2012-09-04 ENCOUNTER — Other Ambulatory Visit: Payer: Self-pay | Admitting: Cardiovascular Disease

## 2012-09-26 ENCOUNTER — Encounter: Payer: Self-pay | Admitting: *Deleted

## 2012-09-26 LAB — REMOTE PACEMAKER DEVICE

## 2012-10-05 ENCOUNTER — Other Ambulatory Visit: Payer: Self-pay | Admitting: Cardiovascular Disease

## 2012-10-05 DIAGNOSIS — R55 Syncope and collapse: Secondary | ICD-10-CM

## 2012-10-05 LAB — PACEMAKER DEVICE OBSERVATION

## 2012-10-12 ENCOUNTER — Encounter: Payer: Self-pay | Admitting: *Deleted

## 2012-10-12 LAB — REMOTE PACEMAKER DEVICE

## 2012-12-08 ENCOUNTER — Other Ambulatory Visit: Payer: Self-pay | Admitting: Cardiovascular Disease

## 2012-12-08 DIAGNOSIS — R55 Syncope and collapse: Secondary | ICD-10-CM

## 2012-12-08 LAB — PACEMAKER DEVICE OBSERVATION

## 2012-12-14 ENCOUNTER — Encounter: Payer: Self-pay | Admitting: *Deleted

## 2012-12-14 LAB — PACEMAKER DEVICE OBSERVATION

## 2012-12-31 HISTORY — PX: CARDIAC CATHETERIZATION: SHX172

## 2013-01-11 ENCOUNTER — Other Ambulatory Visit: Payer: Self-pay | Admitting: Cardiovascular Disease

## 2013-01-11 DIAGNOSIS — R55 Syncope and collapse: Secondary | ICD-10-CM

## 2013-01-15 ENCOUNTER — Telehealth: Payer: Self-pay | Admitting: Cardiovascular Disease

## 2013-01-15 NOTE — Telephone Encounter (Signed)
Thinks he needs to have loop recorder removed.

## 2013-01-15 NOTE — Telephone Encounter (Signed)
Message forwarded to S. Saunders, CMA r/t device/monitor concerns. 

## 2013-01-17 ENCOUNTER — Encounter: Payer: Self-pay | Admitting: *Deleted

## 2013-01-21 ENCOUNTER — Other Ambulatory Visit: Payer: Self-pay | Admitting: *Deleted

## 2013-01-21 MED ORDER — CLOPIDOGREL BISULFATE 75 MG PO TABS: 75.0000 mg | ORAL_TABLET | Freq: Every morning | ORAL | Status: DC

## 2013-01-21 MED ORDER — PANTOPRAZOLE SODIUM 40 MG PO TBEC: 40.0000 mg | DELAYED_RELEASE_TABLET | Freq: Two times a day (BID) | ORAL | Status: DC

## 2013-01-21 MED ORDER — METOPROLOL SUCCINATE ER 25 MG PO TB24: 25.0000 mg | ORAL_TABLET | Freq: Every morning | ORAL | Status: DC

## 2013-01-22 NOTE — Telephone Encounter (Signed)
I informed patient that Dr.Croitoru said that if he wants to have his device removed then we can set him up for an appointment so that this can be discussed. Patient voiced understanding, and agreed to making the appointment, which will be deferred to the schedulers.

## 2013-01-23 ENCOUNTER — Observation Stay (HOSPITAL_COMMUNITY)
Admission: EM | Admit: 2013-01-23 | Discharge: 2013-01-24 | Disposition: A | Discharge status: Home / Self Care | Payer: BC Managed Care – PPO | Attending: Emergency Medicine | Admitting: Emergency Medicine

## 2013-01-23 ENCOUNTER — Encounter (HOSPITAL_COMMUNITY): Payer: Self-pay | Admitting: Family Medicine

## 2013-01-23 ENCOUNTER — Emergency Department (HOSPITAL_COMMUNITY): Payer: BC Managed Care – PPO

## 2013-01-23 DIAGNOSIS — I200 Unstable angina: Secondary | ICD-10-CM

## 2013-01-23 DIAGNOSIS — Z9861 Coronary angioplasty status: Secondary | ICD-10-CM | POA: Insufficient documentation

## 2013-01-23 DIAGNOSIS — I10 Essential (primary) hypertension: Secondary | ICD-10-CM | POA: Diagnosis present

## 2013-01-23 DIAGNOSIS — Z79899 Other long term (current) drug therapy: Secondary | ICD-10-CM | POA: Insufficient documentation

## 2013-01-23 DIAGNOSIS — K219 Gastro-esophageal reflux disease without esophagitis: Secondary | ICD-10-CM | POA: Insufficient documentation

## 2013-01-23 DIAGNOSIS — R7309 Other abnormal glucose: Secondary | ICD-10-CM | POA: Insufficient documentation

## 2013-01-23 DIAGNOSIS — Z8679 Personal history of other diseases of the circulatory system: Secondary | ICD-10-CM

## 2013-01-23 DIAGNOSIS — R0789 Other chest pain: Principal | ICD-10-CM | POA: Insufficient documentation

## 2013-01-23 DIAGNOSIS — Z7902 Long term (current) use of antithrombotics/antiplatelets: Secondary | ICD-10-CM | POA: Insufficient documentation

## 2013-01-23 DIAGNOSIS — E1169 Type 2 diabetes mellitus with other specified complication: Secondary | ICD-10-CM | POA: Diagnosis present

## 2013-01-23 DIAGNOSIS — E785 Hyperlipidemia, unspecified: Secondary | ICD-10-CM | POA: Diagnosis present

## 2013-01-23 DIAGNOSIS — Z7982 Long term (current) use of aspirin: Secondary | ICD-10-CM | POA: Insufficient documentation

## 2013-01-23 DIAGNOSIS — I2510 Atherosclerotic heart disease of native coronary artery without angina pectoris: Secondary | ICD-10-CM | POA: Insufficient documentation

## 2013-01-23 LAB — BASIC METABOLIC PANEL
BUN: 21 mg/dL (ref 6–23)
CO2: 26 mEq/L (ref 19–32)
Chloride: 101 mEq/L (ref 96–112)
Creatinine, Ser: 1.03 mg/dL (ref 0.50–1.35)
GFR calc Af Amer: 87 mL/min — ABNORMAL LOW (ref >90)
Glucose, Bld: 122 mg/dL — ABNORMAL HIGH (ref 70–99)

## 2013-01-23 LAB — CBC
HCT: 40.7 % (ref 39.0–52.0)
MCV: 83.9 fL (ref 78.0–100.0)
RDW: 12 % (ref 11.5–15.5)
WBC: 5.7 10*3/uL (ref 4.0–10.5)

## 2013-01-23 MED ORDER — CELECOXIB 200 MG PO CAPS
200.0000 mg | ORAL_CAPSULE | Freq: Every day | ORAL | Status: DC
  Filled 2013-01-23: qty 1

## 2013-01-23 MED ORDER — CLOPIDOGREL BISULFATE 75 MG PO TABS
75.0000 mg | ORAL_TABLET | Freq: Every morning | ORAL | Status: DC
  Administered 2013-01-24: 75 mg via ORAL
  Filled 2013-01-23: qty 1

## 2013-01-23 MED ORDER — ASPIRIN 81 MG PO CHEW
162.0000 mg | CHEWABLE_TABLET | Freq: Once | ORAL | Status: AC
  Administered 2013-01-23: 162 mg via ORAL
  Filled 2013-01-23: qty 2

## 2013-01-23 MED ORDER — METOPROLOL SUCCINATE ER 25 MG PO TB24
25.0000 mg | ORAL_TABLET | Freq: Every morning | ORAL | Status: DC
  Administered 2013-01-24: 25 mg via ORAL
  Filled 2013-01-23: qty 1

## 2013-01-23 MED ORDER — ONDANSETRON HCL 4 MG/2ML IJ SOLN: 4.0000 mg | Freq: Four times a day (QID) | INTRAMUSCULAR | Status: DC | PRN

## 2013-01-23 MED ORDER — HEPARIN (PORCINE) IN NACL 100-0.45 UNIT/ML-% IJ SOLN
1200.0000 [IU]/h | INTRAMUSCULAR | Status: DC
  Administered 2013-01-23: 1000 [IU]/h via INTRAVENOUS
  Administered 2013-01-24: 1200 [IU]/h via INTRAVENOUS
  Filled 2013-01-23 (×2): qty 250

## 2013-01-23 MED ORDER — SODIUM CHLORIDE 0.9 % IV SOLN: 250.0000 mL | INTRAVENOUS | Status: DC | PRN

## 2013-01-23 MED ORDER — HEPARIN BOLUS VIA INFUSION
4000.0000 [IU] | Freq: Once | INTRAVENOUS | Status: AC
  Administered 2013-01-23: 4000 [IU] via INTRAVENOUS
  Filled 2013-01-23: qty 4000

## 2013-01-23 MED ORDER — RAMIPRIL 5 MG PO CAPS
5.0000 mg | ORAL_CAPSULE | Freq: Every day | ORAL | Status: DC
  Administered 2013-01-23: 5 mg via ORAL
  Filled 2013-01-23 (×3): qty 1

## 2013-01-23 MED ORDER — GI COCKTAIL ~~LOC~~
30.0000 mL | Freq: Three times a day (TID) | ORAL | Status: DC | PRN
  Administered 2013-01-23: 30 mL via ORAL
  Filled 2013-01-23: qty 30

## 2013-01-23 MED ORDER — NITROGLYCERIN 0.4 MG SL SUBL: 0.4000 mg | SUBLINGUAL_TABLET | SUBLINGUAL | Status: DC | PRN

## 2013-01-23 MED ORDER — SODIUM CHLORIDE 0.9 % IJ SOLN: 3.0000 mL | Freq: Two times a day (BID) | INTRAMUSCULAR | Status: DC

## 2013-01-23 MED ORDER — SIMVASTATIN 40 MG PO TABS
40.0000 mg | ORAL_TABLET | Freq: Every day | ORAL | Status: DC
  Filled 2013-01-23: qty 1

## 2013-01-23 MED ORDER — ASPIRIN EC 81 MG PO TBEC
81.0000 mg | DELAYED_RELEASE_TABLET | Freq: Every day | ORAL | Status: DC
  Administered 2013-01-24: 81 mg via ORAL
  Filled 2013-01-23: qty 1

## 2013-01-23 MED ORDER — PANTOPRAZOLE SODIUM 40 MG PO TBEC
40.0000 mg | DELAYED_RELEASE_TABLET | Freq: Two times a day (BID) | ORAL | Status: DC
  Administered 2013-01-23 – 2013-01-24 (×2): 40 mg via ORAL
  Filled 2013-01-23 (×2): qty 1

## 2013-01-23 MED ORDER — SODIUM CHLORIDE 0.9 % IJ SOLN: 3.0000 mL | INTRAMUSCULAR | Status: DC | PRN

## 2013-01-23 MED ORDER — ACETAMINOPHEN 325 MG PO TABS: 650.0000 mg | ORAL_TABLET | ORAL | Status: DC | PRN

## 2013-01-23 NOTE — H&P (Addendum)
[name] is an 62 y.o. male.   Chief Complaint: Chest pain. HPI:   Patient has a history of coronary disease, status post cath in 2005 with moderate disease. Follow-up cath in Feb 2012 with a LAD lesion treated with a 3.0 x 12 mm drug-eluting stent, post dilated to 3.25 mm in the mid LAD. Thirty percent circumflex was also noted with an EF > 60% on a re-look cath(07/2010) showed widely patent stent. His history also includes hypertension, well controlled, Dyslipidemia, Borderline diabetes, History of PSVT(very infrequent episodes).  He had a NST on May 08, 2012 which was read as negative for ischemia with normal wall motion.  He also had a loop recorder implanted in January 2014.  He takes protonix and plavix.   The patient reports chest pressure for the last two weeks, 4/10, with radiation to his throat/neck and mild left arm pain.  Symptoms are similar to previous angina in 2012.  He reports DOE, feeling "swimmy headed" and a 1-2 second episode of almost passing out.  He also says he has been able to lift 50-60 pound bags of feed without exacerbation of pain. The patient currently denies nausea, vomiting, fever, shortness of breath, orthopnea, PND, cough, congestion, abdominal pain, hematochezia, melena, lower extremity edema, claudication.  Medications: Prior to Admission medications   Medication Sig Start Date End Date Taking? Authorizing Provider  aspirin EC 81 MG tablet Take 162 mg by mouth every morning.    Yes Historical Provider, MD  celecoxib (CELEBREX) 200 MG capsule Take 200 mg by mouth daily.   Yes Historical Provider, MD  clopidogrel (PLAVIX) 75 MG tablet Take 1 tablet (75 mg total) by mouth every morning. 01/21/13  Yes [name] Lex, MD  metoprolol succinate (TOPROL-XL) 25 MG 24 hr tablet Take 1 tablet (25 mg total) by mouth every morning. 01/21/13  Yes [name] Lex, MD  pantoprazole (PROTONIX) 40 MG tablet Take 1 tablet (40 mg total) by mouth 2 (two) times daily. 01/21/13   Yes [name] Lex, MD  pravastatin (PRAVACHOL) 80 MG tablet Take 80 mg by mouth at bedtime.   Yes Historical Provider, MD  ramipril (ALTACE) 5 MG capsule Take 5 mg by mouth at bedtime.    Yes Historical Provider, MD  nitroGLYCERIN (NITROSTAT) 0.4 MG SL tablet Place 1 tablet (0.4 mg total) under the tongue every 5 (five) minutes as needed. For chest pain 05/05/12   Henderson Cloud, MD    Past Medical History  Diagnosis Date  . Coronary artery disease   . GERD (gastroesophageal reflux disease)   . Angina   . Seizures     in teenage year's    Past Surgical History  Procedure Laterality Date  . Carotid stent    . Coronary angioplasty  2012  . Hernia repair      Family History  Problem Relation Age of Onset  . Heart disease Mother   . Heart attack Father   . Heart attack Brother    Social History:  reports that he has never smoked. He does not have any smokeless tobacco history on file. He reports that he does not drink alcohol or use illicit drugs.  Allergies: No Known Allergies   (Not in a hospital admission)  Results for orders placed during the hospital encounter of 01/23/13 (from the past 48 hour(s))  CBC     Status: None   Collection Time    01/23/13  2:40 PM      Result  Value Range   WBC 5.7  4.0 - 10.5 K/uL   RBC 4.85  4.22 - 5.81 MIL/uL   Hemoglobin 14.2  13.0 - 17.0 g/dL   HCT 16.1  09.6 - 04.5 %   MCV 83.9  78.0 - 100.0 fL   MCH 29.3  26.0 - 34.0 pg   MCHC 34.9  30.0 - 36.0 g/dL   RDW 40.9  81.1 - 91.4 %   Platelets 200  150 - 400 K/uL  BASIC METABOLIC PANEL     Status: Abnormal   Collection Time    01/23/13  2:40 PM      Result Value Range   Sodium 136  135 - 145 mEq/L   Potassium 4.0  3.5 - 5.1 mEq/L   Chloride 101  96 - 112 mEq/L   CO2 26  19 - 32 mEq/L   Glucose, Bld 122 (*) 70 - 99 mg/dL   BUN 21  6 - 23 mg/dL   Creatinine, Ser 7.82  0.50 - 1.35 mg/dL   Calcium 9.7  8.4 - 95.6 mg/dL   GFR calc non Af Amer 75 (*) >90 mL/min   GFR calc  Af Amer 87 (*) >90 mL/min   Comment: (NOTE)     The eGFR has been calculated using the CKD EPI equation.     This calculation has not been validated in all clinical situations.     eGFR's persistently <90 mL/min signify possible Chronic Kidney     Disease.  POCT I-STAT TROPONIN I     Status: None   Collection Time    01/23/13  3:37 PM      Result Value Range   Troponin i, poc 0.00  0.00 - 0.08 ng/mL   Comment 3            Comment: Due to the release kinetics of cTnI,     a negative result within the first hours     of the onset of symptoms does not rule out     myocardial infarction with certainty.     If myocardial infarction is still suspected,     repeat the test at appropriate intervals.   Dg Chest 2 View  01/23/2013   CLINICAL DATA:  Chest pain, epigastric pain  EXAM: CHEST  2 VIEW  COMPARISON:  05/04/2012  FINDINGS: The heart size and mediastinal contours are within normal limits. No acute infiltrate or pulmonary edema. The visualized skeletal structures are stable.  IMPRESSION: No active cardiopulmonary disease.  No significant change.   Electronically Signed   By: Natasha Mead   On: 01/23/2013 15:25    Review of Systems  Constitutional: Negative for fever, chills and diaphoresis.  HENT: Positive for neck pain (Anteriorly radiated from upper chest.). Negative for congestion and sore throat.   Respiratory: Positive for shortness of breath (With exertion). Negative for cough.   Cardiovascular: Positive for chest pain. Negative for orthopnea, leg swelling and PND.  Gastrointestinal: Negative for nausea, vomiting, abdominal pain, blood in stool and melena.  Genitourinary: Negative for hematuria.  Neurological: Positive for dizziness.  All other systems reviewed and are negative.    Blood pressure 126/76, pulse 80, temperature 98.1 F (36.7 C), temperature source Oral, resp. rate 16, SpO2 96.00%. Physical Exam  Nursing note and vitals reviewed. Constitutional: He is oriented to  person, place, and time. He appears well-developed and well-nourished. No distress.  HENT:  Head: Normocephalic and atraumatic.  Mouth/Throat: No oropharyngeal exudate.  Eyes: EOM are  normal. Pupils are equal, round, and reactive to light. No scleral icterus.  Neck: Normal range of motion. Neck supple. No JVD present.  Cardiovascular: Normal rate, regular rhythm, S1 normal and S2 normal.   No murmur heard. Pulses:      Radial pulses are 2+ on the right side, and 2+ on the left side.       Dorsalis pedis pulses are 2+ on the right side, and 2+ on the left side.  No Carotid bruit.  Respiratory: Effort normal and breath sounds normal. He has no wheezes. He has no rales.  GI: Soft. Bowel sounds are normal. He exhibits no distension. There is no tenderness.  Musculoskeletal: He exhibits no edema.  Lymphadenopathy:    He has no cervical adenopathy.  Neurological: He is alert and oriented to person, place, and time. He exhibits normal muscle tone.  Skin: Skin is warm and dry.  Psychiatric: He has a normal mood and affect.     Assessment/Plan Active Problems:   Unstable angina   CAD (coronary artery disease): Cath April 2012 with  DES to LAD   HTN (hypertension)   HLD (hyperlipidemia)  Plan:  Admit for observation.  Cycle troponin.  Start IV heparin.  Check a P2Y12. Will give GI cocktail. Consider cardiac cath tomorrow.  I am not convinced this is true angina but it is similar to previous episode in 2012.  Will get loop recorder interrogated.  ECG NSR.      [name], [name] 01/23/2013, 5:09 PM   I seen and evaluated the patient this evening after he was seen by Mr. Leron Croak, [place].  I agree with his findings, and examination as well as impression. This patient is quite familiar to me, he does have intermittent episodes that are somewhat difficult to interpret as to whether or not there anginal symptoms or SVT symptoms.  Entirely she is loop recorder did not show any evidence of SVT. Dating back for  the last several months he has noted exertional dyspnea has gotten worse and with less activity.  He then has also now started noting this discomfort in his chest worse of the last several days.  It is a tightness sensation was angled up into his throat but not yet radiating down to the left arm.  He describes today a prior to presentation to the ER having an episode of discomfort [place] farm, he then went to sit down and it resolved spontaneously, and then after his return in a meal he the discomfort came back.  He had noted some nausea along with the discomfort.  Also noting some dyspnea.  He has a couple of episodes did find that him coming in the emergency room.  His exam is essentially benign, as is his ECG.  His initial set of troponins have been negative.  I don't suspect that you to one of his episodes would be enough to actually have caused and ruled in for MI as it lasted for several minutes.  I am however concerned that this symptomatology is consistent with potential crescendo angina is to be considered class III or unstable angina. What concerned him more as the media switching from Ativan to Plavix roughly 6 months ago, and that tended to correlate/coincide with his onset of dyspnea on exertion.  This lesion to be fearful of possible edge restenosis of the stent.  With his presentation, and the most expeditious manner to resolve the LAD it is symptoms is to proceed with cardiac catheterization.  Will continue IV heparin overnight I agree with checking platelet assay. Will plan left heart catheterization and angiography and possible PCI tomorrow morning via the right radial approach. He is on DAPT, statin, beta blocker and ACE inhibitor.  I have discussed the procedure in detail as well as risks, benefits, alternatives and indications of procedure with the patient.  He understands that the risks include but are not limited to ventricular fibrillation or tachycardia, stroke,  cardiac arrest with death, MI either fatal and nonfatal, bleed, renal injury from the contrast, vascular injury, or coronary injury that could require either vascular or cardiac surgery as an emergency.  The patient was understanding of above and agrees to proceed.  [name], M.D., M.S. [facility] [address] [place], [place]  [address]  [phone] Pager # [phone] 01/23/2013 9:57 PM

## 2013-01-23 NOTE — ED Provider Notes (Signed)
62 year old male with a history of coronary disease status post stenting by Dr. Clarene Duke approximately 2 years ago. He presents with a complaint of progressive dyspnea on exertion, feeling of dizziness and lightheadedness as well as a feeling substernal chest pain radiating up the sternum to his neck bilaterally. He states this felt similar to his prior coronary disease that record stenting in the past. At this time the patient is pain-free, symptom-free and on exam is clear heart and lung sounds with EKG consistent with a mild axis deviation with left anterior fascicular block. There is no signs of acute ischemia, troponin pending, anticipate admission to the cardiology service.  Medical screening examination/treatment/procedure(s) were conducted as a shared visit with non-physician practitioner(s) and myself.  I personally evaluated the patient during the encounter.  Clinical Impression:  Unstable angina      Vida Roller, MD 01/23/13 2118

## 2013-01-23 NOTE — ED Provider Notes (Signed)
CSN: [id number]     Arrival date & time 01/23/13  1434 History   First MD Initiated Contact with Patient 01/23/13 1532     Chief Complaint  Patient presents with  . Chest Pain   (Consider location/radiation/quality/duration/timing/severity/associated sxs/prior Treatment) Patient is a 62 y.o. male presenting with chest pain. The history is provided by the patient. No language interpreter was used.  Chest Pain Pain location:  Substernal area Pain quality: dull   Pain radiates to:  Neck Pain radiates to the back: no   Pain severity:  Moderate Onset quality:  Gradual Duration:  1 week Timing:  Intermittent Worsened by:  Nothing tried Associated symptoms: nausea   Associated symptoms: no dizziness, no shortness of breath and no weakness   Risk factors: coronary artery disease     Past Medical History  Diagnosis Date  . Coronary artery disease   . GERD (gastroesophageal reflux disease)   . Angina   . Seizures     in teenage year's   Past Surgical History  Procedure Laterality Date  . Carotid stent    . Coronary angioplasty  2012  . Hernia repair     Family History  Problem Relation Age of Onset  . Heart disease Mother   . Heart attack Father   . Heart attack Brother    History  Substance Use Topics  . Smoking status: Never Smoker   . Smokeless tobacco: Not on file  . Alcohol Use: No    Review of Systems  Respiratory: Negative for shortness of breath.   Cardiovascular: Positive for chest pain.       Radiation into neck  Gastrointestinal: Positive for nausea.  Neurological: Negative for dizziness, weakness and light-headedness.  All other systems reviewed and are negative.   Pt is a 62 year old male who presents with a history of substernal chest pain for the last 1 1/2 weeks. He reports that it has been worsening in the last 1-2 days. He says the pain is substernal and radiates into his neck. He reports that he has had some associated nausea, shortness of breath  and sweats but not with every episode of pain. He reports DOE for the last 4 months. He reports that he had a stent placed approx 2 years ago by Dr. Clarene Duke and he has been wearing a loop recorder for his heart since Jan., with normal results so far. He reports a strong familial history of heart disease; his father deceased from an MI at 44 years old and his brother deceased at age 80 with an MI. He reports that he has GERD and feels like it is well controlled with pantoprazole. He reports that he is currently being followed by Dr. Herbie Baltimore from [facility].     Allergies  Review of patient's allergies indicates no known allergies.  Home Medications   Current Outpatient Rx  Name  Route  Sig  Dispense  Refill  . aspirin EC 81 MG tablet   Oral   Take 162 mg by mouth every morning.          . celecoxib (CELEBREX) 200 MG capsule   Oral   Take 200 mg by mouth daily.         . clopidogrel (PLAVIX) 75 MG tablet   Oral   Take 1 tablet (75 mg total) by mouth every morning.   30 tablet   3   . metoprolol succinate (TOPROL-XL) 25 MG 24 hr tablet   Oral  Take 1 tablet (25 mg total) by mouth every morning.   30 tablet   3   . pantoprazole (PROTONIX) 40 MG tablet   Oral   Take 1 tablet (40 mg total) by mouth 2 (two) times daily.   60 tablet   3   . pravastatin (PRAVACHOL) 80 MG tablet   Oral   Take 80 mg by mouth at bedtime.         . ramipril (ALTACE) 5 MG capsule   Oral   Take 5 mg by mouth at bedtime.          . nitroGLYCERIN (NITROSTAT) 0.4 MG SL tablet   Sublingual   Place 1 tablet (0.4 mg total) under the tongue every 5 (five) minutes as needed. For chest pain   30 tablet   0    BP 140/87  Pulse 87  Temp(Src) 98.1 F (36.7 C) (Oral)  Resp 16  SpO2 98% Physical Exam  Nursing note and vitals reviewed. Constitutional: He is oriented to person, place, and time. He appears well-developed and well-nourished. No distress.  HENT:  Head: Normocephalic  and atraumatic.  Eyes: Pupils are equal, round, and reactive to light.  Neck: Normal range of motion. Neck supple.  Cardiovascular: Normal rate, regular rhythm, normal heart sounds, intact distal pulses and normal pulses.   No pain at time of exam.  Pulmonary/Chest: Effort normal and breath sounds normal.  Abdominal: Soft. Bowel sounds are normal.  Musculoskeletal: Normal range of motion.  Neurological: He is alert and oriented to person, place, and time.  Skin: Skin is warm and dry.  Psychiatric: He has a normal mood and affect. His behavior is normal. Judgment and thought content normal.    ED Course  Procedures (including critical care time) Labs Review Labs Reviewed  CBC  BASIC METABOLIC PANEL   Imaging Review Dg Chest 2 View  01/23/2013   CLINICAL DATA:  Chest pain, epigastric pain  EXAM: CHEST  2 VIEW  COMPARISON:  05/04/2012  FINDINGS: The heart size and mediastinal contours are within normal limits. No acute infiltrate or pulmonary edema. The visualized skeletal structures are stable.  IMPRESSION: No active cardiopulmonary disease.  No significant change.   Electronically Signed   By: Natasha Mead   On: 01/23/2013 15:25    Date: 01/23/2013  Rate:   Rhythm: normal sinus rhythm  QRS Axis: left  Intervals: normal  ST/T Wave abnormalities: normal  Conduction Disutrbances:left anterior fascicular block  Narrative Interpretation: Normal Sinus Rhythm  Old EKG Reviewed: none Reviewed by Dr. Rise Mu PM Re-evaluated. Pt remains symptom free. Vs stable. Talked with consult from [facility].   MDM   1. Unstable angina        Irish Elders, NP 01/24/13 [phone]

## 2013-01-23 NOTE — ED Notes (Signed)
Per pt intermittent chest pain over the past few weeks. sts worse today. sts some dizziness with exertion. sts midsternum chest pain radiating up into neck.

## 2013-01-23 NOTE — Progress Notes (Signed)
ANTICOAGULATION CONSULT NOTE - Initial Consult  Pharmacy Consult for heparin Indication: chest pain/ACS  No Known Allergies  Patient Measurements: weight 82 kg, height 68 inches (per pt)   Heparin Dosing Weight: 82kg  Vital Signs: Temp: 98.1 F (36.7 C) (09/24 1443) Temp src: Oral (09/24 1443) BP: 126/76 mmHg (09/24 1537) Pulse Rate: 80 (09/24 1538)  Labs:  Recent Labs  01/23/13 1440  HGB 14.2  HCT 40.7  PLT 200  CREATININE 1.03    The CrCl is unknown because both a height and weight (above a minimum accepted value) are required for this calculation.   Medical History: Past Medical History  Diagnosis Date  . Coronary artery disease   . GERD (gastroesophageal reflux disease)   . Angina   . Seizures     in teenage year's    Medications:  See med rec  Assessment: Patient is a 62 y.o M with known hx of CAD presented to the ED today with c/o CP.  To start heparin for r/o ACS. Patient reported no hx bleeding issues or recent surgeries.  Goal of Therapy:  Heparin level 0.3-0.7 units/ml Monitor platelets by anticoagulation protocol: Yes   Plan:  1) heparin 4000 units IV x1 bolus, then 1000 units/hr 2) check 6 hour heparin level   Daxx Tiggs P 01/23/2013,6:18 PM

## 2013-01-24 ENCOUNTER — Other Ambulatory Visit: Payer: Self-pay

## 2013-01-24 ENCOUNTER — Encounter (HOSPITAL_COMMUNITY)
Admission: EM | Disposition: A | Discharge status: Home / Self Care | Payer: Self-pay | Source: Home / Self Care | Attending: Emergency Medicine

## 2013-01-24 DIAGNOSIS — I2510 Atherosclerotic heart disease of native coronary artery without angina pectoris: Secondary | ICD-10-CM

## 2013-01-24 HISTORY — PX: LEFT HEART CATHETERIZATION WITH CORONARY ANGIOGRAM: SHX5451

## 2013-01-24 LAB — CBC
HCT: 41.7 % (ref 39.0–52.0)
Hemoglobin: 14.8 g/dL (ref 13.0–17.0)
MCH: 29.6 pg (ref 26.0–34.0)
MCHC: 35.5 g/dL (ref 30.0–36.0)
MCV: 83.4 fL (ref 78.0–100.0)
Platelets: 199 10*3/uL (ref 150–400)
RBC: 5 MIL/uL (ref 4.22–5.81)

## 2013-01-24 LAB — BASIC METABOLIC PANEL
BUN: 16 mg/dL (ref 6–23)
Calcium: 9.2 mg/dL (ref 8.4–10.5)
Chloride: 102 mEq/L (ref 96–112)
GFR calc Af Amer: >90 mL/min (ref >90)
GFR calc non Af Amer: 78 mL/min — ABNORMAL LOW (ref >90)
Potassium: 3.9 mEq/L (ref 3.5–5.1)

## 2013-01-24 LAB — HEMOGLOBIN A1C
Hgb A1c MFr Bld: 6 % — ABNORMAL HIGH (ref <5.7)
Mean Plasma Glucose: 126 mg/dL — ABNORMAL HIGH (ref <117)

## 2013-01-24 LAB — LIPID PANEL
Cholesterol: 122 mg/dL (ref 0–200)
HDL: 47 mg/dL (ref >39)
Total CHOL/HDL Ratio: 2.6 RATIO

## 2013-01-24 LAB — PROTIME-INR: Prothrombin Time: 14.2 seconds (ref 11.6–15.2)

## 2013-01-24 LAB — TSH: TSH: 0.758 u[IU]/mL (ref 0.350–4.500)

## 2013-01-24 SURGERY — LEFT HEART CATHETERIZATION WITH CORONARY ANGIOGRAM
Anesthesia: LOCAL

## 2013-01-24 MED ORDER — LIDOCAINE HCL (PF) 1 % IJ SOLN
INTRAMUSCULAR | Status: AC
  Filled 2013-01-24: qty 30

## 2013-01-24 MED ORDER — FENTANYL CITRATE 0.05 MG/ML IJ SOLN
INTRAMUSCULAR | Status: AC
  Filled 2013-01-24: qty 2

## 2013-01-24 MED ORDER — MORPHINE SULFATE 2 MG/ML IJ SOLN: 2.0000 mg | INTRAMUSCULAR | Status: DC | PRN

## 2013-01-24 MED ORDER — VERAPAMIL HCL 2.5 MG/ML IV SOLN
INTRAVENOUS | Status: AC
  Filled 2013-01-24: qty 2

## 2013-01-24 MED ORDER — SODIUM CHLORIDE 0.9 % IV SOLN: 1.0000 mL/kg/h | INTRAVENOUS | Status: DC

## 2013-01-24 MED ORDER — HEPARIN BOLUS VIA INFUSION
2000.0000 [IU] | Freq: Once | INTRAVENOUS | Status: AC
  Administered 2013-01-24: 2000 [IU] via INTRAVENOUS
  Filled 2013-01-24: qty 2000

## 2013-01-24 MED ORDER — HEPARIN (PORCINE) IN NACL 2-0.9 UNIT/ML-% IJ SOLN
INTRAMUSCULAR | Status: AC
  Filled 2013-01-24: qty 1000

## 2013-01-24 MED ORDER — MIDAZOLAM HCL 2 MG/2ML IJ SOLN
INTRAMUSCULAR | Status: AC
  Filled 2013-01-24: qty 2

## 2013-01-24 MED ORDER — HEPARIN SODIUM (PORCINE) 1000 UNIT/ML IJ SOLN
INTRAMUSCULAR | Status: AC
  Filled 2013-01-24: qty 1

## 2013-01-24 MED ORDER — NITROGLYCERIN 0.2 MG/ML ON CALL CATH LAB
INTRAVENOUS | Status: AC
  Filled 2013-01-24: qty 1

## 2013-01-24 MED ORDER — PRASUGREL HCL 10 MG PO TABS: 10.0000 mg | ORAL_TABLET | Freq: Every day | ORAL | Status: DC

## 2013-01-24 MED ORDER — ESOMEPRAZOLE MAGNESIUM 20 MG PO CPDR: 20.0000 mg | DELAYED_RELEASE_CAPSULE | Freq: Two times a day (BID) | ORAL | Status: DC

## 2013-01-24 NOTE — Progress Notes (Signed)
Utilization review completed. Kayliana Codd, RN, BSN. 

## 2013-01-24 NOTE — Progress Notes (Signed)
ANTICOAGULATION CONSULT NOTE  Pharmacy Consult for heparin Indication: chest pain/ACS  No Known Allergies  Patient Measurements:  Height: 5\' 9"  (175.3 cm) Weight: 176 lb 6.4 oz (80.015 kg) IBW/kg (Calculated) : 70.7 Heparin Dosing Weight: 82kg  Vital Signs: Temp: 98.2 F (36.8 C) (09/24 1924) BP: 135/87 mmHg (09/24 1924) Pulse Rate: 63 (09/24 1924)  Labs:  Recent Labs  01/23/13 1440 01/23/13 2037 01/24/13 0150  HGB 14.2  --   --   HCT 40.7  --   --   PLT 200  --   --   HEPARINUNFRC  --   --  0.25*  CREATININE 1.03  --   --   TROPONINI  --  <0.30  --     Estimated Creatinine Clearance: 73.4 ml/min (by C-G formula based on Cr of 1.03).  Assessment: 62 y.o. male with chest pain for heparin   Goal of Therapy:  Heparin level 0.3-0.7 units/ml Monitor platelets by anticoagulation protocol: Yes   Plan:  Heparin 2000 units IV bolus, then increase heparin  1200 units/hr F/U after cath   Eddie Candle 01/24/2013,2:45 AM

## 2013-01-24 NOTE — CV Procedure (Addendum)
CARDIAC CATHETERIZATION REPORT  NAME:  [name]   MRN: [id number] DOB:  [birth year]   ADMIT DATE: 01/23/2013 Procedure Date: 01/24/2013  INTERVENTIONAL CARDIOLOGIST: Marykay Lex, M.D., MS PRIMARY CARE PROVIDER: Ulanda Edison, MD PRIMARY CARDIOLOGIST: Marykay Lex, MD, MS  PATIENT:  [name] is a 62 y.o. male with a history of CAD-PCI to LAD (Promus Element 3.0 mm x 12 mm --> 3.25 mm) as well as SVT who presented with progressively worsening exertional CP that then occurred at rest.  This was concerning potential Unstable Angina (Class III). With his history of CAD & PCI, he is referred for invasive cardiac evaluation.  PRE-OPERATIVE DIAGNOSIS:    CLASS III ANGINA  PROCEDURES PERFORMED:    LEFT HEART CATHETERIZATION WITH CORONARY ANGIOGRAPHY & LEFT VENTRICULOGRAPHY  PROCEDURE:Consent:  Risks of procedure as well as the alternatives and risks of each were explained to the (patient/caregiver).  Consent for procedure obtained. Consent for signed by MD and patient with RN witness -- placed on chart.   PROCEDURE: The patient was brought to the 2nd Floor [facility] in the fasting state and prepped and draped in the usual sterile fashion for Right groin or radial access. A modified Allen's test with plethysmography was performed, revealing excellent Ulnar artery collateral flow.  Sterile technique was used including antiseptics, cap, gloves, gown, hand hygiene, mask and sheet.  Skin prep: Chlorhexidine.  Time Out: Verified patient identification, verified procedure, site/side was marked, verified correct patient position, special equipment/implants available, medications/allergies/relevent history reviewed, required imaging and test results available.  Performed  Access: Right Radial Artery; 5 Fr Sheath -- Seldinger technique (Angiocath Micropuncture Kit)  Radial Cocktail - IA 10 ml, IV Heparin 4000 ml Diagnostic:  5 French TIG 4.0 & Angled  Pigtail catheters advanced and exchanged over Long exchange safety J-wire  Left and Coronary Artery Angiography: TIG 4.0  LV Hemodynamics (LV Gram): Angled Pigtail.  TR Band:  1155 Hours, 13 mL air  MEDICATIONS:  Anesthesia:  Local Lidocaine 2 ml  Sedation:  2 mg IV Versed, 50 mcg IV fentanyl ;   Omnipaque Contrast: 70 ml  Anticoagulation:  IV Heparin 4000 Units ; Radial Cocktail: 5 mg Verapamil, 400 mcg NTG, 2 ml 2% Lidocaine in 10 ml NS  Hemodynamics:  Central Aortic / Mean Pressures: 128/78 mmHg; 102 mmHg  Left Ventricular Pressures / EDP: 126/3 mmHg; 7 mmHg  Left Ventriculography:  EF: 65%  Wall Motion: normal  Coronary Anatomy:  Left Main: Large caliber, patulous (~ ectatic vessel that bifurcates into a moderate to the LAD & Circumflex. LAD: Continues as a patulous sized distal left main with ectasia with a very proximal septal perforator trunk.  The vessel then tapers down to a more normal size with a bifurcation into ID and a tandem bifurcating diagonal branch the the ostium of the follow on LAD at the diagonal has a widely patent stent with less than 10% in-stent restenosis.  The LAD then continues down around the apex giving rise to several septal perforators but no more diagonal branches.  Minimal luminal irregularities  D1: Large-caliber vessel that bifurcates proximally after giving off a septal trunk the 2 branches at least moderate caliber cover the majority of the anterolateral wall.  Minimal luminal irregularities  Left Circumflex: Large-caliber vessel which essentially terminates as a large lateral OM branch.  The AV groove circumflex comes off the mid vessel and is a small-caliber vessel with an ostial 70% stenosis.  The lateral OM is somewhat  tortuous but free of significant disease.   RCA: Very large caliber, patulous vessel with several RV marginal branches.  It follows on after several bands 10-20% irregularities before bifurcates distally into a Right  Posterior Ascending Artery (RPDA and the  Right Posterior AV Groove Branch (RPAV).  No significant disease  RPDA: Moderate caliber vessel that is quite tortuous and tapers to the apex.  Minimal luminal irregularities is a tapers.  RPL Sysytem:The RPAV begins as a large caliber vessel essentially same size as the distal RCA.  Gives rise to a very small posterolateral branch and then bifurcates into 2 major tandem RPL 2 and RPL 3.  These RPL branches cover the entire inferolateral wall and are free of any significant disease.  PATIENT DISPOSITION:    The patient was transferred to the PACU holding area in a hemodynamicaly stable, chest pain free condition.  The patient tolerated the procedure well, and there were no complications.  EBL:   < 5 ml  The patient was stable before, during, and after the procedure.  POST-OPERATIVE DIAGNOSIS:    Widely patent LAD stent with only minimal luminal irregularities throughout the remainder of the coronary artery system.  No culprit lesion besides potentially the ostial follow on AV groove circumflex which is a very small-caliber vessel.  Well-preserved, hyperdynamic left ventricle with normal EDP  PLAN OF CARE:  Standard post radial cath care.  Despite discharge later on today as is her culprit lesion to explain his presenting symptoms of class III angina.  We'll continue to adjust therapy as an outpatient, would consider Ranexa since small vessel disease is a potential etiology.  Other likely etiology is GI - ? PUD/gastritis -- will adjust medications around as follows:   D/c ASA  Convert back to Effient from Plavix (will need CM consult) -- this will allow for use of Nexium (which he says works better than Protonix)  D/c Celebrex  Convert back to BID Nexium  Contact Dr. Matthias Hughs to arrange OP f/u for GI eval.   Marykay Lex, M.D., M.S. [facility] [address] [place], [place]   [address]  [phone]  01/24/2013 11:56 AM

## 2013-01-24 NOTE — ED Provider Notes (Signed)
Medical screening examination/treatment/procedure(s) were conducted as a shared visit with non-physician practitioner(s) and myself.  I personally evaluated the patient during the encounter  Please see my separate respective documentation pertaining to this patient encounter   Vida Roller, MD 01/24/13 [phone]

## 2013-01-24 NOTE — Interval H&P Note (Signed)
History and Physical Interval Note:  01/24/2013 11:19 AM  [name]  has presented today for surgery, with the diagnosis of UNSTABLE Crescendo ANGINA (Class III) The various methods of treatment have been discussed with the patient and family. After consideration of risks, benefits and other options for treatment, the patient has consented to  Procedure(s): LEFT HEART CATHETERIZATION WITH CORONARY ANGIOGRAM (N/A) +/- PCI as a surgical intervention .  The patient's history has been reviewed, patient examined, no change in status, stable for surgery.  I have reviewed the patient's chart and labs.  Questions were answered to the patient's satisfaction.     Lazette Estala W  Cath Lab Visit (complete for each Cath Lab visit)  Clinical Evaluation Leading to the Procedure:   ACS: no  Non-ACS:    Anginal Classification: CCS III; Crescendo - Unstable angina.  Anti-ischemic medical therapy: Maximal Therapy (2 or more classes of medications)  Non-Invasive Test Results: No non-invasive testing performed  Prior CABG: No previous CABG

## 2013-01-24 NOTE — Discharge Summary (Signed)
Physician Discharge Summary  Patient ID: [name] MRN: [id number] DOB/AGE: Dec 15, 1950 62 y.o.  Admit date: 01/23/2013 Discharge date: 01/24/2013  Admission Diagnoses: Unstable Angina  Discharge Diagnoses:  Active Problems:   Chest Pain - non cardiac (patent stent and vessels on cath 01/24/13), probable GI etiology   CAD (coronary artery disease): Cath April 2012 with  DES to LAD   HTN (hypertension)   HLD (hyperlipidemia)   Discharged Condition: stable  Hospital Course: The patient is a 62 y/o male, with  a history of coronary disease, status post cath in 2005 with moderate disease. Follow-up cath in Feb 2012 demonstrated a LAD lesion, treated with a 3.0 x 12 mm drug-eluting stent, post dilated to 3.25 mm in the mid LAD. Thirty percent circumflex was also noted with an EF > 60%.  On a re-look cath(07/2010), he was found to have a widely patent stent. His history also includes hypertension, well controlled, Dyslipidemia, Borderline diabetes, History of PSVT(very infrequent episodes). He had a NST on May 08, 2012 which was read as negative for ischemia with normal wall motion. He also had a loop recorder implanted in January 2014. He takes protonix for GERD and has been on Plavix.   He presented to [facility] on 01/23/13 with a complaint of chest pain, concerning for unstable angina. His EKG was unremarkalbe and his initial troponin was negative. However, due to his history and symptoms, he was admitted for rule-out of MI and a diagnostic LHC was recommended. Cardiac enzymes were cycled and were negative x 3. The cardiac cath was performed by Dr. Herbie Baltimore via the right radial artery. He was found to have a widely patent LAD stent with only minimal luminal irregularities throughout the remainder of the coronary artery system.  There was no  culprit lesion, besides potentially the ostial follow on AV groove circumflex which is a very small-caliber vessel.  He had well-preserved, hyperdynamic left  ventricle with normal EDP. No PCI was indicated. The decision was made to adjust his medications.  It was felt that other possible etiology for his pain could have been PUD/gastritis. He left the cath lab in stable condition. He had no post cath complications. His ASA was discontinued. He was converted from Plavix to Effient, as the patient had endorsed that Nexium worked better for him than Protonix. He was restarted on Nexium. His Celebrex was also discontinued. An appointment was made for him to see Dr. Matthias Hughs, gastroenterologist, for evaluation of possible PUD/Gastritis. He will see him on 01/25/13. He will follow-up at [facility] with Dr. Herbie Baltimore on 02/05/13. The patient was last seen and examined by Dr. Herbie Baltimore, who determined that he was stable for discharge home.    Consults: None  Significant Diagnostic Studies:   LHC 01/24/13 Hemodynamics:  Central Aortic / Mean Pressures: 128/78 mmHg; 102 mmHg  Left Ventricular Pressures / EDP: 126/3 mmHg; 7 mmHg Left Ventriculography:  EF: 65%  Wall Motion: normal Coronary Anatomy:  Left Main: Large caliber, patulous (~ ectatic vessel that bifurcates into a moderate to the LAD & Circumflex. LAD: Continues as a patulous sized distal left main with ectasia with a very proximal septal perforator trunk. The vessel then tapers down to a more normal size with a bifurcation into ID and a tandem bifurcating diagonal branch the the ostium of the follow on LAD at the diagonal has a widely patent stent with less than 10% in-stent restenosis. The LAD then continues down around the apex giving rise to several septal perforators but  no more diagonal branches. Minimal luminal irregularities  D1: Large-caliber vessel that bifurcates proximally after giving off a septal trunk the 2 branches at least moderate caliber cover the majority of the anterolateral wall. Minimal luminal irregularities Left Circumflex: Large-caliber vessel which essentially terminates as a large lateral  OM branch. The AV groove circumflex comes off the mid vessel and is a small-caliber vessel with an ostial 70% stenosis. The lateral OM is somewhat tortuous but free of significant disease.  RCA: Very large caliber, patulous vessel with several RV marginal branches. It follows on after several bands 10-20% irregularities before bifurcates distally into a Right Posterior Ascending Artery (RPDA and the Right Posterior AV Groove Branch (RPAV). No significant disease  RPDA: Moderate caliber vessel that is quite tortuous and tapers to the apex. Minimal luminal irregularities is a tapers.  RPL Sysytem:The RPAV begins as a large caliber vessel essentially same size as the distal RCA. Gives rise to a very small posterolateral branch and then bifurcates into 2 major tandem RPL 2 and RPL 3. These RPL branches cover the entire inferolateral wall and are free of any significant disease.  Treatments: See Hospital Course  Discharge Exam: Blood pressure 120/74, pulse 67, temperature 98.4 F (36.9 C), temperature source Oral, resp. rate 16, height 5\' 9"  (1.753 m), weight 176 lb 6.4 oz (80.015 kg), SpO2 97.00%.   Disposition: 01-Home or Self Care       Future Appointments Provider Department Dept Phone   02/05/2013 9:00 AM Marykay Lex, MD [facility] [phone]   02/11/2013 6:35 AM [facility] [phone]       Medication List    ASK your doctor about these medications       aspirin EC 81 MG tablet  Take 162 mg by mouth every morning.     celecoxib 200 MG capsule  Commonly known as:  CELEBREX  Take 200 mg by mouth daily.     clopidogrel 75 MG tablet  Commonly known as:  PLAVIX  Take 1 tablet (75 mg total) by mouth every morning.     metoprolol succinate 25 MG 24 hr tablet  Commonly known as:  TOPROL-XL  Take 1 tablet (25 mg total) by mouth every morning.     nitroGLYCERIN 0.4 MG SL tablet  Commonly known as:  NITROSTAT  Place 1  tablet (0.4 mg total) under the tongue every 5 (five) minutes as needed. For chest pain     pantoprazole 40 MG tablet  Commonly known as:  PROTONIX  Take 1 tablet (40 mg total) by mouth 2 (two) times daily.     pravastatin 80 MG tablet  Commonly known as:  PRAVACHOL  Take 80 mg by mouth at bedtime.     ramipril 5 MG capsule  Commonly known as:  ALTACE  Take 5 mg by mouth at bedtime.       Follow-up Information   Follow up with Florencia Reasons, MD On 01/25/2013. (2:15 pm)    Specialty:  Gastroenterology   Contact information:   [address]                         [place] [address] [phone]       Follow up with Marykay Lex, MD On 02/05/2013. (9:00 am)    Specialty:  Cardiology   Contact information:   [address] [place] [address] [phone]  TIME SPENT ON DISCHARGE, INCLUDING PHYSICIAN TIME: >30 MINUTES  Signed: Allayne Butcher, PA-C 01/24/2013, 3:54 PM

## 2013-01-24 NOTE — Care Management Note (Addendum)
    Page 1 of 1   01/24/2013     3:58:07 PM   CARE MANAGEMENT NOTE 01/24/2013  Patient:  SILVESTRE, MINES A   Account Number:  [id number]  Date Initiated:  01/24/2013  Documentation initiated by:  GRAVES-BIGELOW,Cinnamon Morency  Subjective/Objective Assessment:   Pt admitted for CP. S/p cath- pt plans to d/c home on effient. CM did call [facility]  and they do not have medication available. Meds can be ordered and have in am per pharmacy.     Action/Plan:   Benefits check in process for med. Once completed CM will make pt aware. CM did provide pt with 30 day free and co pay cards. Will need Rx for 30 day free no refills.   Anticipated DC Date:  01/24/2013   Anticipated DC Plan:  HOME/SELF CARE      DC Planning Services  CM consult  Medication Assistance      Choice offered to / List presented to:             Status of service:  Completed, signed off Medicare Important Message given?   (If response is "NO", the following Medicare IM given date fields will be blank) Date Medicare IM given:   Date Additional Medicare IM given:    Discharge Disposition:  HOME/SELF CARE  Per UR Regulation:  Reviewed for med. necessity/level of care/duration of stay  If discussed at Long Length of Stay Meetings, dates discussed:    Comments:

## 2013-01-28 NOTE — Discharge Summary (Signed)
Agree with d/c summary See last PN for detail.  Marykay Lex, MD

## 2013-01-29 ENCOUNTER — Encounter: Payer: Self-pay | Admitting: *Deleted

## 2013-02-04 ENCOUNTER — Encounter: Payer: Self-pay | Admitting: Cardiology

## 2013-02-04 DIAGNOSIS — Z9861 Coronary angioplasty status: Secondary | ICD-10-CM

## 2013-02-04 DIAGNOSIS — I2510 Atherosclerotic heart disease of native coronary artery without angina pectoris: Secondary | ICD-10-CM | POA: Insufficient documentation

## 2013-02-05 ENCOUNTER — Other Ambulatory Visit: Payer: Self-pay | Admitting: Cardiovascular Disease

## 2013-02-05 ENCOUNTER — Encounter: Payer: Self-pay | Admitting: Cardiovascular Disease

## 2013-02-05 ENCOUNTER — Ambulatory Visit (INDEPENDENT_AMBULATORY_CARE_PROVIDER_SITE_OTHER): Payer: BC Managed Care – PPO | Admitting: Cardiovascular Disease

## 2013-02-05 ENCOUNTER — Ambulatory Visit (INDEPENDENT_AMBULATORY_CARE_PROVIDER_SITE_OTHER): Payer: BC Managed Care – PPO | Admitting: Cardiology

## 2013-02-05 VITALS — BP 124/82 | HR 82 | Resp 16 | Ht 68.0 in | Wt 178.4 lb

## 2013-02-05 DIAGNOSIS — R55 Syncope and collapse: Secondary | ICD-10-CM

## 2013-02-05 DIAGNOSIS — Z8679 Personal history of other diseases of the circulatory system: Secondary | ICD-10-CM

## 2013-02-05 DIAGNOSIS — R0989 Other specified symptoms and signs involving the circulatory and respiratory systems: Secondary | ICD-10-CM

## 2013-02-05 LAB — PACEMAKER DEVICE OBSERVATION

## 2013-02-05 NOTE — Patient Instructions (Addendum)
Dr Royann Shivers recommended that you get a loop recorder removal in late November.[name]

## 2013-02-06 LAB — PACEMAKER DEVICE OBSERVATION

## 2013-02-08 ENCOUNTER — Encounter: Payer: Self-pay | Admitting: Cardiology

## 2013-02-08 NOTE — Progress Notes (Signed)
Patient ID: [name], male   DOB: 05-17-1950, 62 y.o.   MRN: [id number]      Reason for office visit Discuss implantable loop recorder removal  His recent heart catheterization showed widely patent stent.  Changing his proton pump inhibitor to Detrol and has led to resolution of his chest discomfort. He has not had syncope. After one year of monitoring his loop recorder has not shown any evidence of meaningful arrhythmia and he has not had any further syncope. The device is often uncomfortable. 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  He discussed switching back to clopidogrel instead of prasugrel with Dr. Herbie Baltimore today.   No Known Allergies  Current Outpatient Prescriptions  Medication Sig Dispense Refill  . aspirin 81 MG tablet Take 81 mg by mouth daily.      . Cholecalciferol (VITAMIN D-3) 1000 UNITS CAPS Take 1 capsule by mouth daily.      . clopidogrel (PLAVIX) 75 MG tablet Take 1 tablet by mouth daily.      Marland Kitchen dexlansoprazole (DEXILANT) 60 MG capsule Take 60 mg by mouth daily.      Marland Kitchen LORazepam (ATIVAN) 0.5 MG tablet Take 0.25 mg by mouth as needed for anxiety.      . metoprolol succinate (TOPROL-XL) 25 MG 24 hr tablet Take 1 tablet (25 mg total) by mouth every morning.  30 tablet  3  . nitroGLYCERIN (NITROSTAT) 0.4 MG SL tablet Place 1 tablet (0.4 mg total) under the tongue every 5 (five) minutes as needed. For chest pain  30 tablet  0  . pravastatin (PRAVACHOL) 80 MG tablet Take 80 mg by mouth at bedtime.      . ramipril (ALTACE) 5 MG capsule Take 5 mg by mouth at bedtime.        No current facility-administered medications for this visit.    Past Medical History  Diagnosis Date  . CAD S/P percutaneous coronary angioplasty 2005    s/p PCI of LAD 3.0x35mm Promus DES  . GERD (gastroesophageal reflux disease)   . Seizures     in teenage year's  . History of PSVT (paroxysmal supraventricular  tachycardia)   . History of syncope Jan 2014    s/p Loop Recorder  . Borderline diabetes   . Hypertension   . Dyslipidemia   . History of nuclear stress test 05/08/2012    lexiscan; normal study  . S/P cardiac catheterization 12/2012    Widely patent LAD stent; minimal disease elsewhere; normal EF.    Past Surgical History  Procedure Laterality Date  . Carotid stent    . Hernia repair  2005  . Cardiac catheterization  2005    noncritical CAD, aneursym in prox LAD, normal LV systolic function  . Cardiac catheterization  2/28//2012    3.0x1mm Promus DES to LAD (repeat cath 07/2010 by Dr. Julieanne Manson showed patency of stent)  . Loop recorder implant  05/2012    Medtronic Reveal XT - placed for near-syncopal episodes by Dr. Judie Petit. Breion Novacek  . Transthoracic echocardiogram  05/2012    EF 55-60%, normal wall motion, no valvular abnormalities - ordered for syncope  . Cardiac catheterization  12/2012    widely patent LAD stent; minimal CAD elsewhere; patulous LAD    Family History  Problem Relation Age of Onset  . Heart disease Mother   . Hypertension Mother   . Heart attack Father   . Heart attack  Brother   . Cancer Maternal Grandfather   . Stroke Paternal Grandmother   . Leukemia Maternal Grandfather     History   Social History  . Marital Status: Married    Spouse Name: N/A    Number of Children: 2  . Years of Education: N/A   Occupational History  . chicken farm    Social History Main Topics  . Smoking status: Never Smoker   . Smokeless tobacco: Never Used  . Alcohol Use: No  . Drug Use: No  . Sexual Activity: Yes   Other Topics Concern  . Not on file   Social History Narrative    He is a married father of 2, grandfather of 4. Nonsmoker. 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 No alcohol.     Review of systems: The patient specifically denies any chest pain at rest or with exertion,  dyspnea at rest or with exertion, orthopnea, paroxysmal nocturnal dyspnea, syncope, palpitations, focal neurological deficits, intermittent claudication, lower extremity edema, unexplained weight gain, cough, hemoptysis or wheezing.  The patient also denies abdominal pain, nausea, vomiting, dysphagia, diarrhea, constipation, polyuria, polydipsia, dysuria, hematuria, frequency, urgency, abnormal bleeding or bruising, fever, chills, unexpected weight changes, mood swings, change in skin or hair texture, change in voice quality, auditory or visual problems, allergic reactions or rashes, new musculoskeletal complaints other than usual "aches and pains".   PHYSICAL EXAM BP 124/82  Pulse 82  Resp 16  Ht 5\' 8"  (1.727 m)  Wt 178 lb 6.4 oz (80.922 kg)  BMI 27.13 kg/m2  General: Alert, oriented x3, no distress Head: no evidence of trauma, PERRL, EOMI, no exophtalmos or lid lag, no myxedema, no xanthelasma; normal ears, nose and oropharynx Neck: normal jugular venous pulsations and no hepatojugular reflux; brisk carotid pulses without delay and no carotid bruits Chest: clear to auscultation, no signs of consolidation by percussion or palpation, normal fremitus, symmetrical and full respiratory excursions; healthy loop recorder site in the left parasternal area Cardiovascular: normal position and quality of the apical impulse, regular rhythm, normal first and second heart sounds, no murmurs, rubs or gallops Abdomen: no tenderness or distention, no masses by palpation, no abnormal pulsatility or arterial bruits, normal bowel sounds, no hepatosplenomegaly Extremities: no clubbing, cyanosis or edema; 2+ radial, ulnar and brachial pulses bilaterally; 2+ right femoral, posterior tibial and dorsalis pedis pulses; 2+ left femoral, posterior tibial and dorsalis pedis pulses; no subclavian or femoral bruits Neurological: grossly nonfocal   Lipid Panel     Component Value Date/Time   CHOL 122 01/24/2013 0705    TRIG 59 01/24/2013 0705   HDL 47 01/24/2013 0705   CHOLHDL 2.6 01/24/2013 0705   VLDL 12 01/24/2013 0705   LDLCALC 63 01/24/2013 0705    BMET    Component Value Date/Time   NA 136 01/24/2013 0705   K 3.9 01/24/2013 0705   CL 102 01/24/2013 0705   CO2 24 01/24/2013 0705   GLUCOSE 115* 01/24/2013 0705   BUN 16 01/24/2013 0705   CREATININE 1.00 01/24/2013 0705   CALCIUM 9.2 01/24/2013 0705   GFRNONAA 78* 01/24/2013 0705   GFRAA >90 01/24/2013 0705     ASSESSMENT AND PLAN  The technical details of loop recorder removal and its limited potential complications were discussed with the patient. He would like to do the procedure towards the end of November when his job is less demanding.  Meds  ordered this encounter  Medications  . clopidogrel (PLAVIX) 75 MG tablet    Sig: Take 1 tablet by mouth daily.  Marland Kitchen dexlansoprazole (DEXILANT) 60 MG capsule    Sig: Take 60 mg by mouth daily.  . Cholecalciferol (VITAMIN D-3) 1000 UNITS CAPS    Sig: Take 1 capsule by mouth daily.  Marland Kitchen LORazepam (ATIVAN) 0.5 MG tablet    Sig: Take 0.25 mg by mouth as needed for anxiety.    Junious Silk, MD, [facility] [phone] office [phone] pager

## 2013-02-08 NOTE — Progress Notes (Signed)
   I briefly saw [name] today while he was here to see Dr. Royann Shivers to discuss loop recorder removal.  I wanted to check in on him following his recent catheterization while he was hospitalized for chest discomfort. His heart catheterization showed widely patent stent.  PMH/PSH reviewed. Medications reviewed. Exam: Vital signs reviewed, RRR with normal S1-S2.  Lungs CTA B. and nonlabored.  Pulse is 2+, no C./C./E.  Is most likely that his symptoms were related to his severe GERD.  He initially asked if he could go back on esomeprazole, which has controlled his symptoms significantly.  He said that pantoprazole was not as effective.  He concurred that he would rather have the extra cost of Effient to allow him to use esomeprazole as opposed to using Plavix with the substitution of pantoprazole  I referred him back to Dr. Matthias Hughs from gastroenterology who has actually started the patient on Dexilant, which will allow [place] to go back to using Plavix instead of reverting back to Prasugrel plus Nexium.  He seems to relatively well now with no more significant symptoms. I am fully happy with him back on Plavix especially with the option of Dexilant.  His loop recorder is .not sure any recurrence of PSVT, and has had no further episodes of syncope.  I agree that the loop recorder can probably be removed.    He'll followup with me as originally scheduled.  Marykay Lex, M.D., M.S. [facility] [address] [place], [place]  [address]  [phone] Pager # [phone]

## 2013-02-11 ENCOUNTER — Encounter: Payer: Self-pay | Admitting: Cardiovascular Disease

## 2013-02-14 ENCOUNTER — Ambulatory Visit (INDEPENDENT_AMBULATORY_CARE_PROVIDER_SITE_OTHER): Payer: BC Managed Care – PPO

## 2013-02-14 DIAGNOSIS — R55 Syncope and collapse: Secondary | ICD-10-CM

## 2013-02-14 DIAGNOSIS — Z8679 Personal history of other diseases of the circulatory system: Secondary | ICD-10-CM

## 2013-02-27 ENCOUNTER — Encounter: Payer: Self-pay | Admitting: *Deleted

## 2013-02-27 LAB — REMOTE PACEMAKER DEVICE

## 2013-03-13 ENCOUNTER — Encounter (HOSPITAL_COMMUNITY): Payer: Self-pay | Admitting: Pharmacy Technician

## 2013-03-19 ENCOUNTER — Ambulatory Visit (INDEPENDENT_AMBULATORY_CARE_PROVIDER_SITE_OTHER): Payer: BC Managed Care – PPO | Admitting: *Deleted

## 2013-03-19 DIAGNOSIS — R55 Syncope and collapse: Secondary | ICD-10-CM

## 2013-03-25 ENCOUNTER — Encounter (HOSPITAL_COMMUNITY)
Admission: RE | Disposition: A | Discharge status: Home / Self Care | Payer: Self-pay | Source: Ambulatory Visit | Attending: Cardiovascular Disease

## 2013-03-25 ENCOUNTER — Ambulatory Visit (HOSPITAL_COMMUNITY)
Admission: RE | Admit: 2013-03-25 | Discharge: 2013-03-25 | Disposition: A | Discharge status: Home / Self Care | Payer: BC Managed Care – PPO | Source: Ambulatory Visit | Attending: Cardiovascular Disease | Admitting: Cardiovascular Disease

## 2013-03-25 DIAGNOSIS — I2510 Atherosclerotic heart disease of native coronary artery without angina pectoris: Secondary | ICD-10-CM | POA: Insufficient documentation

## 2013-03-25 DIAGNOSIS — Z8679 Personal history of other diseases of the circulatory system: Secondary | ICD-10-CM

## 2013-03-25 DIAGNOSIS — Z7902 Long term (current) use of antithrombotics/antiplatelets: Secondary | ICD-10-CM | POA: Insufficient documentation

## 2013-03-25 DIAGNOSIS — I10 Essential (primary) hypertension: Secondary | ICD-10-CM | POA: Insufficient documentation

## 2013-03-25 DIAGNOSIS — Z7982 Long term (current) use of aspirin: Secondary | ICD-10-CM | POA: Insufficient documentation

## 2013-03-25 DIAGNOSIS — R7309 Other abnormal glucose: Secondary | ICD-10-CM | POA: Insufficient documentation

## 2013-03-25 DIAGNOSIS — R55 Syncope and collapse: Secondary | ICD-10-CM

## 2013-03-25 DIAGNOSIS — Z4509 Encounter for adjustment and management of other cardiac device: Secondary | ICD-10-CM | POA: Insufficient documentation

## 2013-03-25 HISTORY — PX: LOOP RECORDER EXPLANT: SHX5476

## 2013-03-25 LAB — MDC_IDC_ENUM_SESS_TYPE_REMOTE

## 2013-03-25 SURGERY — LOOP RECORDER EXPLANT
Anesthesia: LOCAL

## 2013-03-25 MED ORDER — LIDOCAINE HCL (PF) 1 % IJ SOLN
INTRAMUSCULAR | Status: AC
  Filled 2013-03-25: qty 30

## 2013-03-25 MED ORDER — SODIUM CHLORIDE 0.9 % IR SOLN
80.0000 mg | Status: DC
  Filled 2013-03-25 (×3): qty 2

## 2013-03-25 MED ORDER — CHLORHEXIDINE GLUCONATE 4 % EX LIQD: 60.0000 mL | Freq: Once | CUTANEOUS | Status: DC

## 2013-03-25 MED ORDER — SODIUM CHLORIDE 0.9 % IV SOLN: INTRAVENOUS | Status: DC

## 2013-03-25 NOTE — H&P (Signed)
Reason for visit: implantable loop recorder removal   Patient has a history of coronary disease, status post cath in 2005 with moderate disease. Follow-up cath in Feb 2012 with a LAD lesion treated with a 3.0 x 12 mm drug-eluting stent, post dilated to 3.25 mm in the mid LAD. Thirty percent circumflex was also noted with an EF > 60% on a re-look cath(07/2010) showed widely patent stent. His history also includes hypertension, well controlled, Dyslipidemia, Borderline diabetes, History of PSVT(very infrequent episodes). He had a NST on May 08, 2012 which was read as negative for ischemia with normal wall motion. He also had a loop recorder implanted in January 2014. He takes protonix and plavix. September 2014 cath: widely patent LAD stent with only minimal luminal irregularities throughout the remainder of the coronary artery system. No culprit lesion besides potentially the ostial follow on AV groove circumflex which is a very small-caliber vessel. Well-preserved, hyperdynamic left ventricle with normal EDP   He has not had syncope. After one year of monitoring his loop recorder has not shown any evidence of meaningful arrhythmia and he has not had any further syncope. The device is often uncomfortable. 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    No Known Allergies  Current Outpatient Prescriptions   Medication  Sig  Dispense  Refill   .  aspirin 81 MG tablet  Take 81 mg by mouth daily.     .  Cholecalciferol (VITAMIN D-3) 1000 UNITS CAPS  Take 1 capsule by mouth daily.     .  clopidogrel (PLAVIX) 75 MG tablet  Take 1 tablet by mouth daily.     Marland Kitchen  dexlansoprazole (DEXILANT) 60 MG capsule  Take 60 mg by mouth daily.     Marland Kitchen  LORazepam (ATIVAN) 0.5 MG tablet  Take 0.25 mg by mouth as needed for anxiety.     .  metoprolol succinate (TOPROL-XL) 25 MG 24 hr tablet  Take 1 tablet (25 mg total) by mouth every morning.  30 tablet  3    .  nitroGLYCERIN (NITROSTAT) 0.4 MG SL tablet  Place 1 tablet (0.4 mg total) under the tongue every 5 (five) minutes as needed. For chest pain  30 tablet  0   .  pravastatin (PRAVACHOL) 80 MG tablet  Take 80 mg by mouth at bedtime.     .  ramipril (ALTACE) 5 MG capsule  Take 5 mg by mouth at bedtime.      No current facility-administered medications for this visit.    Past Medical History   Diagnosis  Date   .  CAD S/P percutaneous coronary angioplasty  2005     s/p PCI of LAD 3.0x43mm Promus DES   .  GERD (gastroesophageal reflux disease)    .  Seizures      in teenage year's   .  History of PSVT (paroxysmal supraventricular tachycardia)    .  History of syncope  Jan 2014     s/p Loop Recorder   .  Borderline diabetes    .  Hypertension    .  Dyslipidemia    .  History of nuclear stress test  05/08/2012     lexiscan; normal study   .  S/P cardiac catheterization  12/2012     Widely patent LAD stent; minimal disease elsewhere; normal EF.    Past Surgical History   Procedure  Laterality  Date   .  Carotid stent     .  Hernia repair   2005   .  Cardiac catheterization   2005     noncritical CAD, aneursym in prox LAD, normal LV systolic function   .  Cardiac catheterization   2/28//2012     3.0x17mm Promus DES to LAD (repeat cath 07/2010 by Dr. Julieanne Manson showed patency of stent)   .  Loop recorder implant   05/2012     Medtronic Reveal XT - placed for near-syncopal episodes by Dr. Judie Petit. Ashaad Gaertner   .  Transthoracic echocardiogram   05/2012     EF 55-60%, normal wall motion, no valvular abnormalities - ordered for syncope   .  Cardiac catheterization   12/2012     widely patent LAD stent; minimal CAD elsewhere; patulous LAD    Family History   Problem  Relation  Age of Onset   .  Heart disease  Mother    .  Hypertension  Mother    .  Heart attack  Father    .  Heart attack  Brother    .  Cancer  Maternal Grandfather    .  Stroke  Paternal Grandmother    .  Leukemia  Maternal  Grandfather     History    Social History   .  Marital Status:  Married     Spouse Name:  N/A     Number of Children:  2   .  Years of Education:  N/A    Occupational History   .  chicken farm     Social History Main Topics   .  Smoking status:  Never Smoker   .  Smokeless tobacco:  Never Used   .  Alcohol Use:  No   .  Drug Use:  No   .  Sexual Activity:  Yes    Other Topics  Concern   .  Not on file    Social History Narrative    He is a married father of 2, grandfather of 4. Nonsmoker. Does not get routine exercise,    but he does walk a significant amount around his chicken farm and he is very active. No alcohol.   Review of systems:  The patient specifically denies any chest pain at rest or with exertion, dyspnea at rest or with exertion, orthopnea, paroxysmal nocturnal dyspnea, syncope, palpitations, focal neurological deficits, intermittent claudication, lower extremity edema, unexplained weight gain, cough, hemoptysis or wheezing.  The patient also denies abdominal pain, nausea, vomiting, dysphagia, diarrhea, constipation, polyuria, polydipsia, dysuria, hematuria, frequency, urgency, abnormal bleeding or bruising, fever, chills, unexpected weight changes, mood swings, change in skin or hair texture, change in voice quality, auditory or visual problems, allergic reactions or rashes, new musculoskeletal complaints other than usual "aches and pains".  PHYSICAL EXAM  BP 124/82  Pulse 82  Resp 16  Ht 5\' 8"  (1.727 m)  Wt 178 lb 6.4 oz (80.922 kg)  BMI 27.13 kg/m2  General: Alert, oriented x3, no distress  Head: no evidence of trauma, PERRL, EOMI, no exophtalmos or lid lag, no myxedema, no xanthelasma; normal ears, nose and oropharynx  Neck: normal jugular venous pulsations and no hepatojugular reflux; brisk carotid pulses without delay and no carotid bruits  Chest: clear to auscultation, no signs of consolidation by percussion or palpation, normal fremitus, symmetrical and  full respiratory excursions; healthy loop recorder site in the left parasternal area  Cardiovascular: normal position and quality of the apical impulse, regular rhythm,  normal first and second heart sounds, no murmurs, rubs or gallops  Abdomen: no tenderness or distention, no masses by palpation, no abnormal pulsatility or arterial bruits, normal bowel sounds, no hepatosplenomegaly  Extremities: no clubbing, cyanosis or edema; 2+ radial, ulnar and brachial pulses bilaterally; 2+ right femoral, posterior tibial and dorsalis pedis pulses; 2+ left femoral, posterior tibial and dorsalis pedis pulses; no subclavian or femoral bruits  Neurological: grossly nonfocal   Low risk for ILR removal today. This procedure has been fully reviewed with the patient and written informed consent has been obtained.

## 2013-03-25 NOTE — Op Note (Signed)
Procedure performed: Loop recorder explantation  Procedure performed by: Thurmon Fair, MD  Medications administered: Lidocaine 1% 10 cc locally; no sedation was given  The patient was prepped and draped in sterile fashion. Local anesthesia was administered to the area of the loop recorder. A 2 cm horizontal incision was made. The pocket was dissected down to the level of the pectoralis fascia and the loop recorder was explanted. The pocket was flushed with antibiotic solution. It was inspected carefully for hemostasis. The pocket was then closed in 2 layers using 2-0 Vicryl and 4-0 Vicryl respectively. Steri-Strips and a sterile dressing were then applied. Local pressure was held for some slow oozing probably related to clopidogrel therapy. No immediate complications occurred.  Thurmon Fair, MD, [facility] [phone] office [phone] pager

## 2013-04-02 ENCOUNTER — Ambulatory Visit: Payer: BC Managed Care – PPO | Admitting: Cardiology

## 2013-04-10 ENCOUNTER — Encounter: Payer: Self-pay | Admitting: Cardiology

## 2013-04-10 ENCOUNTER — Ambulatory Visit (INDEPENDENT_AMBULATORY_CARE_PROVIDER_SITE_OTHER): Payer: BC Managed Care – PPO | Admitting: Cardiology

## 2013-04-10 VITALS — BP 120/70 | HR 80 | Ht 68.0 in | Wt 176.0 lb

## 2013-04-10 DIAGNOSIS — Z9861 Coronary angioplasty status: Secondary | ICD-10-CM

## 2013-04-10 DIAGNOSIS — I2510 Atherosclerotic heart disease of native coronary artery without angina pectoris: Secondary | ICD-10-CM

## 2013-04-10 DIAGNOSIS — R55 Syncope and collapse: Secondary | ICD-10-CM

## 2013-04-10 NOTE — Patient Instructions (Signed)
Site looks good.  Follow up in 6 months with Dr. Herbie Baltimore April 2015.

## 2013-04-10 NOTE — Progress Notes (Signed)
04/10/2013   PCP: Ulanda Edison, MD   Chief Complaint  Patient presents with  . Follow-up    site check    Primary Cardiologist: Dr. Herbie Baltimore  HPI:  62 year old WM here today for site check after loop recorder removal.  He had no arrhthymias for a year and due to his work the site was uncomfortable.  It was removed by Dr Royann Shivers 03/26/13 without complications.   He denies chest pain or SOB.  Previous chest pain was found to be GI though he does have CAD and  Had cath with stable previous LAD stent.   No Known Allergies  Current Outpatient Prescriptions  Medication Sig Dispense Refill  . aspirin 81 MG tablet Take 162 mg by mouth every morning.       . Cholecalciferol (VITAMIN D-3) 1000 UNITS CAPS Take 1,000 Units by mouth daily.       . clopidogrel (PLAVIX) 75 MG tablet Take 75 mg by mouth daily.       Marland Kitchen dexlansoprazole (DEXILANT) 60 MG capsule Take 60 mg by mouth daily.      Marland Kitchen LORazepam (ATIVAN) 0.5 MG tablet Take 0.25 mg by mouth every 4 (four) hours as needed for anxiety.       . metoprolol succinate (TOPROL-XL) 25 MG 24 hr tablet Take 1 tablet (25 mg total) by mouth every morning.  30 tablet  3  . nitroGLYCERIN (NITROSTAT) 0.4 MG SL tablet Place 1 tablet (0.4 mg total) under the tongue every 5 (five) minutes as needed. For chest pain  30 tablet  0  . pravastatin (PRAVACHOL) 80 MG tablet Take 80 mg by mouth at bedtime.      . ramipril (ALTACE) 5 MG capsule Take 5 mg by mouth at bedtime.        No current facility-administered medications for this visit.    Past Medical History  Diagnosis Date  . CAD S/P percutaneous coronary angioplasty 2005    s/p PCI of LAD 3.0x73mm Promus DES  . GERD (gastroesophageal reflux disease)   . Seizures     in teenage year's  . History of PSVT (paroxysmal supraventricular tachycardia)   . History of syncope Jan 2014    s/p Loop Recorder  . Borderline diabetes   . Hypertension   . Dyslipidemia   . History of nuclear stress  test 05/08/2012    lexiscan; normal study  . S/P cardiac catheterization 12/2012    Widely patent LAD stent; minimal disease elsewhere; normal EF.    Past Surgical History  Procedure Laterality Date  . Carotid stent    . Hernia repair  2005  . Cardiac catheterization  2005    noncritical CAD, aneursym in prox LAD, normal LV systolic function  . Cardiac catheterization  2/28//2012    3.0x89mm Promus DES to LAD (repeat cath 07/2010 by Dr. Julieanne Manson showed patency of stent)  . Loop recorder implant  05/2012    Medtronic Reveal XT - placed for near-syncopal episodes by Dr. Judie Petit. Croitoru  . Transthoracic echocardiogram  05/2012    EF 55-60%, normal wall motion, no valvular abnormalities - ordered for syncope  . Cardiac catheterization  12/2012    widely patent LAD stent; minimal CAD elsewhere; patulous LAD    ZOX:WRUEAVW:UJ colds or fevers, no weight changes CV:see HPI PUL:see HPI Neuro:no syncope, no lightheadedness  PHYSICAL EXAM BP 120/70  Pulse 80  Ht 5\' 8"  (1.727 m)  Wt 176 lb (79.833  kg)  BMI 26.77 kg/m2 General:Pleasant affect, NAD Skin:Warm and dry, brisk capillary refill Heart:S1S2 RRR without murmur, gallup, rub or click Lungs:clear without rales, rhonchi, or wheezes Neuro:alert and oriented, MAE, follows commands, + facial symmetry   ASSESSMENT AND PLAN Near syncope - s/p Loop Recorder implant, now removed 03/26/13  Loop removed 03/26/13 without complications.  Site well healed.  He is to follow up with Dr. Herbie Baltimore in 6 months from Oct.  CAD S/P percutaneous coronary angioplasty No chest pain.

## 2013-04-10 NOTE — Assessment & Plan Note (Signed)
Loop removed 03/26/13 without complications.  Site well healed.  He is to follow up with Dr. Herbie Baltimore in 6 months from Oct.

## 2013-04-10 NOTE — Assessment & Plan Note (Signed)
No chest pain

## 2013-05-17 ENCOUNTER — Other Ambulatory Visit: Payer: Self-pay | Admitting: Cardiology

## 2013-05-17 NOTE — Telephone Encounter (Signed)
Rx was sent to pharmacy electronically. 

## 2013-08-20 ENCOUNTER — Other Ambulatory Visit: Payer: Self-pay | Admitting: Cardiology

## 2013-08-20 NOTE — Telephone Encounter (Signed)
Rx was sent to pharmacy electronically. 

## 2014-01-07 ENCOUNTER — Emergency Department (HOSPITAL_COMMUNITY): Payer: BC Managed Care – PPO

## 2014-01-07 ENCOUNTER — Encounter (HOSPITAL_COMMUNITY): Payer: Self-pay | Admitting: Emergency Medicine

## 2014-01-07 ENCOUNTER — Emergency Department (HOSPITAL_COMMUNITY)
Admission: EM | Admit: 2014-01-07 | Discharge: 2014-01-07 | Disposition: A | Discharge status: Home / Self Care | Payer: BC Managed Care – PPO | Attending: Emergency Medicine | Admitting: Emergency Medicine

## 2014-01-07 DIAGNOSIS — S42023A Displaced fracture of shaft of unspecified clavicle, initial encounter for closed fracture: Secondary | ICD-10-CM | POA: Insufficient documentation

## 2014-01-07 DIAGNOSIS — Z9889 Other specified postprocedural states: Secondary | ICD-10-CM | POA: Diagnosis not present

## 2014-01-07 DIAGNOSIS — I2510 Atherosclerotic heart disease of native coronary artery without angina pectoris: Secondary | ICD-10-CM | POA: Diagnosis not present

## 2014-01-07 DIAGNOSIS — W1789XA Other fall from one level to another, initial encounter: Secondary | ICD-10-CM | POA: Insufficient documentation

## 2014-01-07 DIAGNOSIS — K219 Gastro-esophageal reflux disease without esophagitis: Secondary | ICD-10-CM | POA: Insufficient documentation

## 2014-01-07 DIAGNOSIS — Y9289 Other specified places as the place of occurrence of the external cause: Secondary | ICD-10-CM | POA: Diagnosis not present

## 2014-01-07 DIAGNOSIS — I10 Essential (primary) hypertension: Secondary | ICD-10-CM | POA: Insufficient documentation

## 2014-01-07 DIAGNOSIS — Z7982 Long term (current) use of aspirin: Secondary | ICD-10-CM | POA: Diagnosis not present

## 2014-01-07 DIAGNOSIS — M129 Arthropathy, unspecified: Secondary | ICD-10-CM | POA: Diagnosis not present

## 2014-01-07 DIAGNOSIS — E785 Hyperlipidemia, unspecified: Secondary | ICD-10-CM | POA: Diagnosis not present

## 2014-01-07 DIAGNOSIS — S42001A Fracture of unspecified part of right clavicle, initial encounter for closed fracture: Secondary | ICD-10-CM

## 2014-01-07 DIAGNOSIS — I498 Other specified cardiac arrhythmias: Secondary | ICD-10-CM | POA: Diagnosis not present

## 2014-01-07 DIAGNOSIS — Z79899 Other long term (current) drug therapy: Secondary | ICD-10-CM | POA: Insufficient documentation

## 2014-01-07 DIAGNOSIS — G40909 Epilepsy, unspecified, not intractable, without status epilepticus: Secondary | ICD-10-CM | POA: Diagnosis not present

## 2014-01-07 DIAGNOSIS — S4980XA Other specified injuries of shoulder and upper arm, unspecified arm, initial encounter: Secondary | ICD-10-CM | POA: Insufficient documentation

## 2014-01-07 DIAGNOSIS — Z7902 Long term (current) use of antithrombotics/antiplatelets: Secondary | ICD-10-CM | POA: Insufficient documentation

## 2014-01-07 DIAGNOSIS — S46909A Unspecified injury of unspecified muscle, fascia and tendon at shoulder and upper arm level, unspecified arm, initial encounter: Secondary | ICD-10-CM | POA: Diagnosis present

## 2014-01-07 DIAGNOSIS — Y93E8 Activity, other personal hygiene: Secondary | ICD-10-CM | POA: Insufficient documentation

## 2014-01-07 MED ORDER — OXYCODONE-ACETAMINOPHEN 5-325 MG PO TABS: 2.0000 | ORAL_TABLET | ORAL | Status: DC | PRN

## 2014-01-07 MED ORDER — HYDROMORPHONE HCL PF 1 MG/ML IJ SOLN
1.0000 mg | Freq: Once | INTRAMUSCULAR | Status: AC
  Administered 2014-01-07: 1 mg via INTRAVENOUS
  Filled 2014-01-07: qty 1

## 2014-01-07 MED ORDER — ONDANSETRON HCL 4 MG/2ML IJ SOLN
4.0000 mg | Freq: Once | INTRAMUSCULAR | Status: AC
  Administered 2014-01-07: 4 mg via INTRAVENOUS
  Filled 2014-01-07: qty 2

## 2014-01-07 NOTE — ED Notes (Signed)
Dr. Wentz at bedside. 

## 2014-01-07 NOTE — Progress Notes (Signed)
Orthopedic Tech Progress Note Patient Details:  [name] 1950/10/02 [id number]  Ortho Devices Type of Ortho Device: Shoulder immobilizer Ortho Device/Splint Location: RUE Ortho Device/Splint Interventions: Ordered;Application   Jennye Moccasin 01/07/2014, 3:48 PM

## 2014-01-07 NOTE — ED Notes (Signed)
Patient transported to X-ray 

## 2014-01-07 NOTE — ED Provider Notes (Signed)
CSN: [id number]     Arrival date & time 01/07/14  1322 History   First MD Initiated Contact with Patient 01/07/14 1342     Chief Complaint  Patient presents with  . Fall  . Shoulder Injury    Right     (Consider location/radiation/quality/duration/timing/severity/associated sxs/prior Treatment) HPI  [name] is a 63 y.o. male who was washing his tractor, when he slipped and fell about 5 feet to the ground onto his right shoulder. He was able to get up, ambulate, but felt nausea, andincreased pain in his right shoulder. He came here by EMS for evaluation. He was treated with narcotic pain medicines, with improvement. He was transferred with an immobilization device. He denies pain or injury to head, neck, or back. There is no pain in his lower legs. There are no known modifying factors.   Past Medical History  Diagnosis Date  . CAD S/P percutaneous coronary angioplasty 2005    s/p PCI of LAD 3.0x74mm Promus DES  . GERD (gastroesophageal reflux disease)   . Seizures     in teenage year's  . History of PSVT (paroxysmal supraventricular tachycardia)   . History of syncope Jan 2014    s/p Loop Recorder  . Borderline diabetes   . Hypertension   . Dyslipidemia   . History of nuclear stress test 05/08/2012    lexiscan; normal study  . S/P cardiac catheterization 12/2012    Widely patent LAD stent; minimal disease elsewhere; normal EF.  . Right clavicle fracture   . Arthritis    Past Surgical History  Procedure Laterality Date  . Carotid stent    . Hernia repair  2005  . Cardiac catheterization  2005    noncritical CAD, aneursym in prox LAD, normal LV systolic function  . Cardiac catheterization  2/28//2012    3.0x70mm Promus DES to LAD (repeat cath 07/2010 by Dr. Julieanne Manson showed patency of stent)  . Loop recorder implant  05/2012    Medtronic Reveal XT - placed for near-syncopal episodes by Dr. Judie Petit. Croitoru  . Transthoracic echocardiogram  05/2012    EF 55-60%, normal  wall motion, no valvular abnormalities - ordered for syncope  . Cardiac catheterization  12/2012    widely patent LAD stent; minimal CAD elsewhere; patulous LAD  . Colonoscopy w/ biopsies and polypectomy     Family History  Problem Relation Age of Onset  . Heart disease Mother   . Hypertension Mother   . Heart attack Father   . Heart attack Brother   . Cancer Maternal Grandfather   . Stroke Paternal Grandmother   . Leukemia Maternal Grandfather    History  Substance Use Topics  . Smoking status: Never Smoker   . Smokeless tobacco: Never Used  . Alcohol Use: No    Review of Systems  All other systems reviewed and are negative.     Allergies  Review of patient's allergies indicates no known allergies.  Home Medications   Prior to Admission medications   Medication Sig Start Date End Date Taking? Authorizing Provider  aspirin 81 MG tablet Take 162 mg by mouth every morning.    Yes Historical Provider, MD  Cholecalciferol (VITAMIN D-3) 1000 UNITS CAPS Take 1,000 Units by mouth daily.    Yes Historical Provider, MD  clopidogrel (PLAVIX) 75 MG tablet TAKE ONE TABLET BY MOUTH EVERY MORNING 05/17/13  Yes Marykay Lex, MD  dexlansoprazole (DEXILANT) 60 MG capsule Take 60 mg by mouth daily.   Yes  Historical Provider, MD  metoprolol succinate (TOPROL-XL) 25 MG 24 hr tablet Take 25 mg by mouth daily.   Yes Historical Provider, MD  nitroGLYCERIN (NITROSTAT) 0.4 MG SL tablet Place 1 tablet (0.4 mg total) under the tongue every 5 (five) minutes as needed. For chest pain 05/05/12  Yes Estela Isaiah Blakes, MD  oxyCODONE-acetaminophen (PERCOCET) 5-325 MG per tablet Take 2 tablets by mouth every 4 (four) hours as needed. 01/07/14   Flint Melter, MD  pravastatin (PRAVACHOL) 80 MG tablet Take 80 mg by mouth at bedtime.   Yes Historical Provider, MD  ramipril (ALTACE) 5 MG capsule Take 5 mg by mouth daily.   Yes Historical Provider, MD   BP 123/74  Pulse 77  Temp(Src) 98.7 F (37.1 C)  (Oral)  Resp 11  Ht  (1.727 m)  Wt 176 lb (79.833 kg)  BMI 26.77 kg/m2  SpO2 98% Physical Exam  Nursing note and vitals reviewed. Constitutional: He is oriented to person, place, and time. He appears well-developed and well-nourished. He appears distressed (He is uncomfortable).  HENT:  Head: Normocephalic and atraumatic.  Right Ear: External ear normal.  Left Ear: External ear normal.  Eyes: Conjunctivae and EOM are normal. Pupils are equal, round, and reactive to light.  Neck: Normal range of motion and phonation normal. Neck supple.  Cardiovascular: Normal rate, regular rhythm, normal heart sounds and intact distal pulses.   Pulmonary/Chest: Effort normal and breath sounds normal. He exhibits no bony tenderness.  Abdominal: Soft. There is no tenderness.  Musculoskeletal:  Decreased range of motion right shoulder, secondary to pain, and a visible and palpable deformity of the right clavicle. There is no break in the skin. There is no tenting of the skin. There is no pain with passive movement of the right lateral humeral joint.  Neurological: He is alert and oriented to person, place, and time. No cranial nerve deficit or sensory deficit. He exhibits normal muscle tone. Coordination normal.  Skin: Skin is warm, dry and intact.  Psychiatric: He has a normal mood and affect. His behavior is normal. Judgment and thought content normal.    ED Course  Procedures (including critical care time)  Medications  HYDROmorphone (DILAUDID) injection 1 mg (1 mg Intravenous Given 01/07/14 1414)  ondansetron (ZOFRAN) injection 4 mg (4 mg Intravenous Given 01/07/14 1410)    No data found.   At discharge- Reevaluation with update and discussion. After initial assessment and treatment, an updated evaluation reveals . He is more comfortable, in the shoulder immobilizer, and the pain is improved. Findings discussed with patient and wife. I was able to schedule a followup appointment for next day for  evaluation by orthopedics. Lenardo Westwood L   Labs Review Labs Reviewed - No data to display  Imaging Review No results found.   EKG Interpretation None      MDM   Final diagnoses:  Clavicle fracture, right, closed, initial encounter    Fall with isolated injury to right clavicle. No evidence for head injury, neck injury, or back injury. Doubt rib fractures.  Nursing Notes Reviewed/ Care Coordinated Applicable Imaging Reviewed Interpretation of Laboratory Data incorporated into ED treatment  The patient appears reasonably screened and/or stabilized for discharge and I doubt any other medical condition or other [facility] requiring further screening, evaluation, or treatment in the ED at this time prior to discharge.  Plan: Home Medications- Percocet; Home Treatments- arm sling until seen in followup; return here if the recommended treatment, does not improve the  symptoms; Recommended follow up- Orthopedics     Flint Melter, MD 01/10/14 [phone]

## 2014-01-07 NOTE — ED Notes (Signed)
EMS - Pt coming from home after suffering from a fall from his tractor.  Pt denies LOC.  Pt given 50 and 25 of Fentanyl.  Pt in a KED and neck brace on admission.  Pt was sinus rhythm on the monitor.  Pt states he fell approximately 5 feet.  Right shoulder has deformity.  2/10 pain.    Pt alert and oriented on admission.

## 2014-01-07 NOTE — Discharge Instructions (Signed)
Clavicle Fracture °The clavicle, also called the collarbone, is the long bone that connects your shoulder to your rib cage. You can feel your collarbone at the top of your shoulders and rib cage. A clavicle fracture is a broken clavicle. It is a common injury that can happen at any age.  °CAUSES °Common causes of a clavicle fracture include: °· A direct blow to your shoulder. °· A car accident. °· A fall, especially if you try to break your fall with an outstretched arm. °RISK FACTORS °You may be at increased risk if: °· You are younger than 25 years or older than 75 years. Most clavicle fractures happen to people who are younger than 25 years. °· You are a male. °· You play contact sports. °SIGNS AND SYMPTOMS °A fractured clavicle is painful. It also makes it hard to move your arm. Other signs and symptoms may include: °· A shoulder that drops downward and forward. °· Pain when trying to lift your shoulder. °· Bruising, swelling, and tenderness over your clavicle. °· A grinding noise when you try to move your shoulder. °· A bump over your clavicle. °DIAGNOSIS °Your health care provider can usually diagnose a clavicle fracture by asking about your injury and examining your shoulder and clavicle. He or she may take an X-ray to determine the position of your clavicle. °TREATMENT °Treatment depends on the position of your clavicle after the fracture: °· If the broken ends of the bone are not out of place, your health care provider may put your arm in a sling or wrap a support bandage around your chest (figure-of-eight wrap). °· If the broken ends of the bone are out of place, you may need surgery. Surgery may involve placing screws, pins, or plates to keep your clavicle stable while it heals. Healing may take about 3 months. °When your health care provider thinks your fracture has healed enough, you may have to do physical therapy to regain normal movement and build up your arm strength. °HOME CARE INSTRUCTIONS   °· Apply ice to the injured area: °¨ Put ice in a plastic bag. °¨ Place a towel between your skin and the bag. °¨ Leave the ice on for 20 minutes, 2-3 times a day. °· If you have a wrap or splint: °¨ Wear it all the time, and remove it only to take a bath or shower. °¨ When you bathe or shower, keep your shoulder in the same position as when the sling or wrap is on. °¨ Do not lift your arm. °· If you have a figure-of-eight wrap: °¨ Another person must tighten it every day. °¨ It should be tight enough to hold your shoulders back. °¨ Allow enough room to place your index finger between your body and the strap. °¨ Loosen the wrap immediately if you feel numbness or tingling in your hands. °· Only take medicines as directed by your health care provider. °· Avoid activities that make the injury or pain worse for 4-6 weeks after surgery. °· Keep all follow-up appointments. °SEEK MEDICAL CARE IF:  °Your medicine is not helping to relieve pain and swelling. °SEEK IMMEDIATE MEDICAL CARE IF:  °Your arm is numb, cold, or pale, even when the splint is loose. °MAKE SURE YOU:  °· Understand these instructions. °· Will watch your condition. °· Will get help right away if you are not doing well or get worse. °Document Released: 01/26/2005 Document Revised: 04/23/2013 Document Reviewed: 03/11/2013 °[employer]® Patient Information ©2015 [employer]. This information is   not intended to replace advice given to you by your health care provider. Make sure you discuss any questions you have with your health care provider. ° °

## 2014-01-09 ENCOUNTER — Encounter (HOSPITAL_COMMUNITY): Payer: Self-pay | Admitting: *Deleted

## 2014-01-09 ENCOUNTER — Encounter (HOSPITAL_COMMUNITY): Payer: Self-pay

## 2014-01-09 NOTE — H&P (Signed)
[name] is an 63 y.o. male.    Chief Complaint: right shoulder pain   HPI: Pt is a 63 y.o. male complaining of right shoulder pain for 2 days after falling off of his tractor onto right shoulder. Pain had continually increased since the beginning. X-rays in the clinic show right clavicle fracture with displacement. Pt has tried various conservative treatments which have failed to alleviate their symptoms.. Various options are discussed with the patient. Risks, benefits and expectations were discussed with the patient. Patient understand the risks, benefits and expectations and wishes to proceed with surgery.   PCP:  Ulanda Edison, MD  D/C Plans:  Home   PMH: Past Medical History  Diagnosis Date  . CAD S/P percutaneous coronary angioplasty 2005    s/p PCI of LAD 3.0x72mm Promus DES  . GERD (gastroesophageal reflux disease)   . Seizures     in teenage year's  . History of PSVT (paroxysmal supraventricular tachycardia)   . History of syncope Jan 2014    s/p Loop Recorder  . Borderline diabetes   . Hypertension   . Dyslipidemia   . History of nuclear stress test 05/08/2012    lexiscan; normal study  . S/P cardiac catheterization 12/2012    Widely patent LAD stent; minimal disease elsewhere; normal EF.  . Right clavicle fracture   . Arthritis     PSH: Past Surgical History  Procedure Laterality Date  . Carotid stent    . Hernia repair  2005  . Cardiac catheterization  2005    noncritical CAD, aneursym in prox LAD, normal LV systolic function  . Cardiac catheterization  2/28//2012    3.0x11mm Promus DES to LAD (repeat cath 07/2010 by Dr. Julieanne Manson showed patency of stent)  . Loop recorder implant  05/2012    Medtronic Reveal XT - placed for near-syncopal episodes by Dr. Judie Petit. Croitoru  . Transthoracic echocardiogram  05/2012    EF 55-60%, normal wall motion, no valvular abnormalities - ordered for syncope  . Cardiac catheterization  12/2012    widely patent LAD stent;  minimal CAD elsewhere; patulous LAD  . Colonoscopy w/ biopsies and polypectomy      Social History:  reports that he has never smoked. He has never used smokeless tobacco. He reports that he does not drink alcohol or use illicit drugs.  Allergies:  No Known Allergies  Medications: No current facility-administered medications for this encounter.   Current Outpatient Prescriptions  Medication Sig Dispense Refill  . aspirin 81 MG tablet Take 162 mg by mouth every morning.       . Cholecalciferol (VITAMIN D-3) 1000 UNITS CAPS Take 1,000 Units by mouth daily.       . clopidogrel (PLAVIX) 75 MG tablet TAKE ONE TABLET BY MOUTH EVERY MORNING  30 tablet  11  . dexlansoprazole (DEXILANT) 60 MG capsule Take 60 mg by mouth daily.      . metoprolol succinate (TOPROL-XL) 25 MG 24 hr tablet Take 25 mg by mouth daily.      . nitroGLYCERIN (NITROSTAT) 0.4 MG SL tablet Place 1 tablet (0.4 mg total) under the tongue every 5 (five) minutes as needed. For chest pain  30 tablet  0  . oxyCODONE-acetaminophen (PERCOCET) 5-325 MG per tablet Take 2 tablets by mouth every 4 (four) hours as needed.  20 tablet  0  . pravastatin (PRAVACHOL) 80 MG tablet Take 80 mg by mouth at bedtime.      . ramipril (ALTACE) 5 MG capsule Take  5 mg by mouth daily.        No results found for this or any previous visit (from the past 48 hour(s)). Dg Clavicle Right  01/07/2014   CLINICAL DATA:  Clavicle pain and deformity secondary to a fall.  EXAM: RIGHT CLAVICLE - 2+ VIEWS  COMPARISON:  None.  FINDINGS: There is a comminuted displaced angulated fracture of the midshaft of the right clavicle.  IMPRESSION: Right clavicle fracture as described.   Electronically Signed   By: Geanie Cooley M.D.   On: 01/07/2014 15:11    ROS: Pain with rom of the right upper extremity  Physical Exam:  Alert and oriented 63 y.o. male in no acute distress Cranial nerves 2-12 intact Cervical spine: full rom with no tenderness, nv intact distally Chest:  active breath sounds bilaterally, no wheeze rhonchi or rales Heart: regular rate and rhythm, no murmur Abd: non tender non distended with active bowel sounds Hip is stable with rom  Right shoulder with moderate edema and very limited rom of right upper extremity nv intact distally No rashes or signs of open injury  Assessment/Plan Assessment: right clavicle fracture with displacement  Plan: Patient will undergo a right clavicle ORIF by Dr. Ranell Patrick at [facility]. Risks benefits and expectations were discussed with the patient. Patient understand risks, benefits and expectations and wishes to proceed.

## 2014-01-09 NOTE — Progress Notes (Signed)
01/09/14 1234  OBSTRUCTIVE SLEEP APNEA  Have you ever been diagnosed with sleep apnea through a sleep study? No  Do you snore loudly (loud enough to be heard through closed doors)?  0  Do you often feel tired, fatigued, or sleepy during the daytime? 0  Has anyone observed you stop breathing during your sleep? 1  Do you have, or are you being treated for high blood pressure? 1  BMI more than 35 kg/m2? 0  Age over 65 years old? 1  Gender: 1  Obstructive Sleep Apnea Score 4  Score 4 or greater  Results sent to PCP

## 2014-01-09 NOTE — Progress Notes (Signed)
Pt stated that he was told by surgeon's staff that he can have a light breakfast and to not take Aspirin and Plavix on the morning of surgery.

## 2014-01-10 ENCOUNTER — Ambulatory Visit (HOSPITAL_COMMUNITY): Payer: BC Managed Care – PPO

## 2014-01-10 ENCOUNTER — Encounter (HOSPITAL_COMMUNITY)
Admission: RE | Disposition: A | Discharge status: Home / Self Care | Payer: Self-pay | Source: Ambulatory Visit | Attending: Orthopedic Surgery

## 2014-01-10 ENCOUNTER — Ambulatory Visit (HOSPITAL_COMMUNITY): Payer: BC Managed Care – PPO | Admitting: Anesthesiology

## 2014-01-10 ENCOUNTER — Ambulatory Visit (HOSPITAL_COMMUNITY)
Admission: RE | Admit: 2014-01-10 | Discharge: 2014-01-10 | Disposition: A | Discharge status: Home / Self Care | Payer: BC Managed Care – PPO | Source: Ambulatory Visit | Attending: Orthopedic Surgery | Admitting: Orthopedic Surgery

## 2014-01-10 ENCOUNTER — Encounter (HOSPITAL_COMMUNITY): Payer: Self-pay | Admitting: *Deleted

## 2014-01-10 ENCOUNTER — Encounter (HOSPITAL_COMMUNITY): Payer: BC Managed Care – PPO | Admitting: Anesthesiology

## 2014-01-10 DIAGNOSIS — M1990 Unspecified osteoarthritis, unspecified site: Secondary | ICD-10-CM | POA: Diagnosis not present

## 2014-01-10 DIAGNOSIS — S42009A Fracture of unspecified part of unspecified clavicle, initial encounter for closed fracture: Secondary | ICD-10-CM | POA: Diagnosis present

## 2014-01-10 DIAGNOSIS — W1789XA Other fall from one level to another, initial encounter: Secondary | ICD-10-CM | POA: Diagnosis not present

## 2014-01-10 DIAGNOSIS — I2510 Atherosclerotic heart disease of native coronary artery without angina pectoris: Secondary | ICD-10-CM | POA: Insufficient documentation

## 2014-01-10 DIAGNOSIS — K219 Gastro-esophageal reflux disease without esophagitis: Secondary | ICD-10-CM | POA: Diagnosis not present

## 2014-01-10 DIAGNOSIS — E785 Hyperlipidemia, unspecified: Secondary | ICD-10-CM | POA: Diagnosis not present

## 2014-01-10 DIAGNOSIS — S42023A Displaced fracture of shaft of unspecified clavicle, initial encounter for closed fracture: Secondary | ICD-10-CM | POA: Diagnosis not present

## 2014-01-10 DIAGNOSIS — I10 Essential (primary) hypertension: Secondary | ICD-10-CM | POA: Insufficient documentation

## 2014-01-10 HISTORY — DX: Fracture of unspecified part of right clavicle, initial encounter for closed fracture: S42.001A

## 2014-01-10 HISTORY — PX: ORIF CLAVICULAR FRACTURE: SHX5055

## 2014-01-10 HISTORY — DX: Unspecified osteoarthritis, unspecified site: M19.90

## 2014-01-10 LAB — BASIC METABOLIC PANEL
ANION GAP: 11 (ref 5–15)
BUN: 19 mg/dL (ref 6–23)
CO2: 26 meq/L (ref 19–32)
CREATININE: 0.97 mg/dL (ref 0.50–1.35)
Calcium: 9 mg/dL (ref 8.4–10.5)
Chloride: 101 mEq/L (ref 96–112)
GFR calc Af Amer: >90 mL/min (ref >90)
GFR calc non Af Amer: 85 mL/min — ABNORMAL LOW (ref >90)
Glucose, Bld: 91 mg/dL (ref 70–99)
Potassium: 4.4 mEq/L (ref 3.7–5.3)
Sodium: 138 mEq/L (ref 137–147)

## 2014-01-10 LAB — CBC
HCT: 44.5 % (ref 39.0–52.0)
HEMOGLOBIN: 15.7 g/dL (ref 13.0–17.0)
MCH: 29.8 pg (ref 26.0–34.0)
MCHC: 35.3 g/dL (ref 30.0–36.0)
MCV: 84.4 fL (ref 78.0–100.0)
Platelets: 198 10*3/uL (ref 150–400)
RBC: 5.27 MIL/uL (ref 4.22–5.81)
RDW: 12.9 % (ref 11.5–15.5)
WBC: 7 10*3/uL (ref 4.0–10.5)

## 2014-01-10 SURGERY — OPEN REDUCTION INTERNAL FIXATION (ORIF) CLAVICULAR FRACTURE
Anesthesia: General | Laterality: Right

## 2014-01-10 MED ORDER — KETOROLAC TROMETHAMINE 30 MG/ML IJ SOLN
INTRAMUSCULAR | Status: AC
  Filled 2014-01-10: qty 1

## 2014-01-10 MED ORDER — PROPOFOL 10 MG/ML IV BOLUS
INTRAVENOUS | Status: AC
  Filled 2014-01-10: qty 20

## 2014-01-10 MED ORDER — MIDAZOLAM HCL 2 MG/2ML IJ SOLN
INTRAMUSCULAR | Status: AC
  Filled 2014-01-10: qty 2

## 2014-01-10 MED ORDER — BUPIVACAINE-EPINEPHRINE 0.25% -1:200000 IJ SOLN
INTRAMUSCULAR | Status: DC | PRN
  Administered 2014-01-10: 17 mL

## 2014-01-10 MED ORDER — METHOCARBAMOL 500 MG PO TABS
500.0000 mg | ORAL_TABLET | Freq: Once | ORAL | Status: AC
  Administered 2014-01-10: 500 mg via ORAL

## 2014-01-10 MED ORDER — CHLORHEXIDINE GLUCONATE 4 % EX LIQD
60.0000 mL | Freq: Once | CUTANEOUS | Status: DC
  Filled 2014-01-10: qty 60

## 2014-01-10 MED ORDER — NEOSTIGMINE METHYLSULFATE 10 MG/10ML IV SOLN
INTRAVENOUS | Status: AC
  Filled 2014-01-10: qty 1

## 2014-01-10 MED ORDER — 0.9 % SODIUM CHLORIDE (POUR BTL) OPTIME
TOPICAL | Status: DC | PRN
  Administered 2014-01-10: 1000 mL

## 2014-01-10 MED ORDER — BUPIVACAINE-EPINEPHRINE (PF) 0.25% -1:200000 IJ SOLN
INTRAMUSCULAR | Status: AC
  Filled 2014-01-10: qty 30

## 2014-01-10 MED ORDER — OXYCODONE-ACETAMINOPHEN 5-325 MG PO TABS
ORAL_TABLET | ORAL | Status: AC
  Filled 2014-01-10: qty 1

## 2014-01-10 MED ORDER — METHOCARBAMOL 500 MG PO TABS
ORAL_TABLET | ORAL | Status: AC
  Filled 2014-01-10: qty 1

## 2014-01-10 MED ORDER — LIDOCAINE HCL (CARDIAC) 20 MG/ML IV SOLN
INTRAVENOUS | Status: AC
  Filled 2014-01-10: qty 5

## 2014-01-10 MED ORDER — FENTANYL CITRATE 0.05 MG/ML IJ SOLN
INTRAMUSCULAR | Status: AC
  Filled 2014-01-10: qty 5

## 2014-01-10 MED ORDER — CEFAZOLIN SODIUM-DEXTROSE 2-3 GM-% IV SOLR
INTRAVENOUS | Status: AC
  Filled 2014-01-10: qty 50

## 2014-01-10 MED ORDER — METHOCARBAMOL 500 MG PO TABS: 500.0000 mg | ORAL_TABLET | Freq: Three times a day (TID) | ORAL | Status: DC | PRN

## 2014-01-10 MED ORDER — FENTANYL CITRATE 0.05 MG/ML IJ SOLN
INTRAMUSCULAR | Status: DC | PRN
  Administered 2014-01-10: 50 ug via INTRAVENOUS
  Administered 2014-01-10: 100 ug via INTRAVENOUS
  Administered 2014-01-10 (×2): 50 ug via INTRAVENOUS

## 2014-01-10 MED ORDER — HYDROMORPHONE HCL PF 1 MG/ML IJ SOLN
0.2500 mg | INTRAMUSCULAR | Status: DC | PRN
  Administered 2014-01-10 (×4): 0.5 mg via INTRAVENOUS

## 2014-01-10 MED ORDER — OXYCODONE-ACETAMINOPHEN 5-325 MG PO TABS: 1.0000 | ORAL_TABLET | ORAL | Status: DC | PRN

## 2014-01-10 MED ORDER — ONDANSETRON HCL 4 MG/2ML IJ SOLN
INTRAMUSCULAR | Status: AC
  Filled 2014-01-10: qty 2

## 2014-01-10 MED ORDER — ONDANSETRON HCL 4 MG/2ML IJ SOLN
INTRAMUSCULAR | Status: DC | PRN
  Administered 2014-01-10: 4 mg via INTRAVENOUS

## 2014-01-10 MED ORDER — LACTATED RINGERS IV SOLN
INTRAVENOUS | Status: DC
  Administered 2014-01-10 (×2): via INTRAVENOUS

## 2014-01-10 MED ORDER — OXYCODONE-ACETAMINOPHEN 5-325 MG PO TABS
1.0000 | ORAL_TABLET | Freq: Once | ORAL | Status: AC
  Administered 2014-01-10: 1 via ORAL

## 2014-01-10 MED ORDER — LIDOCAINE HCL (CARDIAC) 20 MG/ML IV SOLN
INTRAVENOUS | Status: DC | PRN
  Administered 2014-01-10: 60 mg via INTRAVENOUS

## 2014-01-10 MED ORDER — ROCURONIUM BROMIDE 100 MG/10ML IV SOLN
INTRAVENOUS | Status: DC | PRN
  Administered 2014-01-10: 40 mg via INTRAVENOUS

## 2014-01-10 MED ORDER — ROCURONIUM BROMIDE 50 MG/5ML IV SOLN
INTRAVENOUS | Status: AC
  Filled 2014-01-10: qty 1

## 2014-01-10 MED ORDER — KETOROLAC TROMETHAMINE 30 MG/ML IJ SOLN
INTRAMUSCULAR | Status: DC | PRN
  Administered 2014-01-10: 15 mg via INTRAVENOUS

## 2014-01-10 MED ORDER — GLYCOPYRROLATE 0.2 MG/ML IJ SOLN
INTRAMUSCULAR | Status: AC
  Filled 2014-01-10: qty 2

## 2014-01-10 MED ORDER — PROPOFOL 10 MG/ML IV BOLUS
INTRAVENOUS | Status: DC | PRN
  Administered 2014-01-10: 160 mg via INTRAVENOUS

## 2014-01-10 MED ORDER — GLYCOPYRROLATE 0.2 MG/ML IJ SOLN
INTRAMUSCULAR | Status: DC | PRN
  Administered 2014-01-10: 0.4 mg via INTRAVENOUS

## 2014-01-10 MED ORDER — MIDAZOLAM HCL 5 MG/5ML IJ SOLN
INTRAMUSCULAR | Status: DC | PRN
  Administered 2014-01-10: 2 mg via INTRAVENOUS

## 2014-01-10 MED ORDER — DEXAMETHASONE SODIUM PHOSPHATE 4 MG/ML IJ SOLN
INTRAMUSCULAR | Status: DC | PRN
  Administered 2014-01-10: 4 mg via INTRAVENOUS

## 2014-01-10 MED ORDER — HYDROMORPHONE HCL PF 1 MG/ML IJ SOLN
INTRAMUSCULAR | Status: AC
  Administered 2014-01-10: 0.5 mg via INTRAVENOUS
  Filled 2014-01-10: qty 1

## 2014-01-10 MED ORDER — CEFAZOLIN SODIUM-DEXTROSE 2-3 GM-% IV SOLR
2.0000 g | INTRAVENOUS | Status: AC
  Administered 2014-01-10: 2 g via INTRAVENOUS

## 2014-01-10 MED ORDER — DEXAMETHASONE SODIUM PHOSPHATE 4 MG/ML IJ SOLN
INTRAMUSCULAR | Status: AC
  Filled 2014-01-10: qty 1

## 2014-01-10 MED ORDER — NEOSTIGMINE METHYLSULFATE 10 MG/10ML IV SOLN
INTRAVENOUS | Status: DC | PRN
  Administered 2014-01-10: 2 mg via INTRAVENOUS

## 2014-01-10 SURGICAL SUPPLY — 54 items
BIT DRILL 3.2 STERILE (BIT) ×1
BIT DRILL 3.2MM STRL (BIT) IMPLANT
BIT DRILL Q COUPLING 4.5 (BIT) IMPLANT
BIT DRILL Q/COUPLING 1 (BIT) IMPLANT
CLEANER TIP ELECTROSURG 2X2 (MISCELLANEOUS) ×3 IMPLANT
CLOSURE STERI-STRIP 1/2X4 (GAUZE/BANDAGES/DRESSINGS) ×1
CLOSURE WOUND 1/2 X4 (GAUZE/BANDAGES/DRESSINGS) ×2
CLSR STERI-STRIP ANTIMIC 1/2X4 (GAUZE/BANDAGES/DRESSINGS) ×1 IMPLANT
DRAPE C-ARM 42X72 X-RAY (DRAPES) ×3 IMPLANT
DRAPE INCISE IOBAN 66X45 STRL (DRAPES) ×3 IMPLANT
DRAPE U-SHAPE 47X51 STRL (DRAPES) ×3 IMPLANT
DRILL BIT 3.2MM STERILE (BIT) ×3
DRSG ADAPTIC 3X8 NADH LF (GAUZE/BANDAGES/DRESSINGS) ×2 IMPLANT
DRSG EMULSION OIL 3X3 NADH (GAUZE/BANDAGES/DRESSINGS) ×3 IMPLANT
DRSG PAD ABDOMINAL 8X10 ST (GAUZE/BANDAGES/DRESSINGS) ×2 IMPLANT
DURAPREP 26ML APPLICATOR (WOUND CARE) ×3 IMPLANT
ELECT NDL TIP 2.8 STRL (NEEDLE) ×1 IMPLANT
ELECT NEEDLE TIP 2.8 STRL (NEEDLE) ×3 IMPLANT
ELECT REM PT RETURN 9FT ADLT (ELECTROSURGICAL) ×3
ELECTRODE REM PT RTRN 9FT ADLT (ELECTROSURGICAL) ×1 IMPLANT
GAUZE SPONGE 4X4 12PLY STRL (GAUZE/BANDAGES/DRESSINGS) ×3 IMPLANT
GLOVE BIOGEL PI ORTHO PRO 7.5 (GLOVE) ×2
GLOVE BIOGEL PI ORTHO PRO SZ8 (GLOVE) ×2
GLOVE ORTHO TXT STRL SZ7.5 (GLOVE) ×3 IMPLANT
GLOVE PI ORTHO PRO STRL 7.5 (GLOVE) ×1 IMPLANT
GLOVE PI ORTHO PRO STRL SZ8 (GLOVE) ×1 IMPLANT
GLOVE SURG ORTHO 8.5 STRL (GLOVE) ×3 IMPLANT
GOWN STRL REUS W/ TWL LRG LVL3 (GOWN DISPOSABLE) ×2 IMPLANT
GOWN STRL REUS W/ TWL XL LVL3 (GOWN DISPOSABLE) ×2 IMPLANT
GOWN STRL REUS W/TWL LRG LVL3 (GOWN DISPOSABLE) ×6
GOWN STRL REUS W/TWL XL LVL3 (GOWN DISPOSABLE) ×6
KIT BASIN OR (CUSTOM PROCEDURE TRAY) ×3 IMPLANT
KIT ROOM TURNOVER OR (KITS) ×3 IMPLANT
MANIFOLD NEPTUNE II (INSTRUMENTS) ×3 IMPLANT
NDL HYPO 25GX1X1/2 BEV (NEEDLE) ×1 IMPLANT
NEEDLE HYPO 25GX1X1/2 BEV (NEEDLE) ×3 IMPLANT
NS IRRIG 1000ML POUR BTL (IV SOLUTION) ×3 IMPLANT
PACK SHOULDER (CUSTOM PROCEDURE TRAY) ×3 IMPLANT
PAD ARMBOARD 7.5X6 YLW CONV (MISCELLANEOUS) ×6 IMPLANT
PIN CLAVICLE ASSEMBLY 3.0MM (Pin) ×2 IMPLANT
SLING ARM IMMOBILIZER LRG (SOFTGOODS) ×2 IMPLANT
SLING ARM LRG ADULT FOAM STRAP (SOFTGOODS) ×3 IMPLANT
SPONGE GAUZE 4X4 12PLY STER LF (GAUZE/BANDAGES/DRESSINGS) ×2 IMPLANT
SPONGE LAP 4X18 X RAY DECT (DISPOSABLE) ×6 IMPLANT
STRIP CLOSURE SKIN 1/2X4 (GAUZE/BANDAGES/DRESSINGS) ×4 IMPLANT
SUCTION FRAZIER TIP 10 FR DISP (SUCTIONS) ×3 IMPLANT
SUT MNCRL AB 4-0 PS2 18 (SUTURE) ×3 IMPLANT
SUT VIC AB 2-0 CT1 27 (SUTURE) ×3
SUT VIC AB 2-0 CT1 TAPERPNT 27 (SUTURE) ×1 IMPLANT
SUT VICRYL 0 CT 1 36IN (SUTURE) ×3 IMPLANT
SYR CONTROL 10ML LL (SYRINGE) ×3 IMPLANT
TOWEL OR 17X24 6PK STRL BLUE (TOWEL DISPOSABLE) ×3 IMPLANT
TOWEL OR 17X26 10 PK STRL BLUE (TOWEL DISPOSABLE) ×3 IMPLANT
WATER STERILE IRR 1000ML POUR (IV SOLUTION) ×1 IMPLANT

## 2014-01-10 NOTE — Brief Op Note (Signed)
01/10/2014  4:54 PM  PATIENT:  [name]  63 y.o. male  PRE-OPERATIVE DIAGNOSIS:  RIGHT CLAVICAL FRACTURE, Displaced, comminuted  POST-OPERATIVE DIAGNOSIS:  RIGHT CLAVICAL FRACTURE, displaced, comminuted  PROCEDURE:  Procedure(s): OPEN REDUCTION INTERNAL FIXATION (ORIF) RIGHT  CLAVICULAR FRACTURE (Right), DePuy Clavicle pin  SURGEON:  Surgeon(s) and Role:    * Verlee Rossetti, MD - Primary  PHYSICIAN ASSISTANT:   ASSISTANTS: Thea Gist, PA-C   ANESTHESIA:   local and general  EBL:  Total I/O In: 1000 [I.V.:1000] Out: 75 [Blood:75]  BLOOD ADMINISTERED:none  DRAINS: none   LOCAL MEDICATIONS USED:  MARCAINE     SPECIMEN:  No Specimen  DISPOSITION OF SPECIMEN:  N/A  COUNTS:  YES  TOURNIQUET:  * No tourniquets in log *  DICTATION: .Other Dictation: Dictation Number [id number]  PLAN OF CARE: Discharge to home after PACU  PATIENT DISPOSITION:  PACU - hemodynamically stable.   Delay start of Pharmacological VTE agent (>24hrs) due to surgical blood loss or risk of bleeding: not applicable

## 2014-01-10 NOTE — Interval H&P Note (Signed)
History and Physical Interval Note:  01/10/2014 3:09 PM  [name]  has presented today for surgery, with the diagnosis of RIGHT CLAVICAL FRACTURE  The various methods of treatment have been discussed with the patient and family. After consideration of risks, benefits and other options for treatment, the patient has consented to  Procedure(s): OPEN REDUCTION INTERNAL FIXATION (ORIF) RIGHT  CLAVICULAR FRACTURE (Right) as a surgical intervention .  The patient's history has been reviewed, patient examined, no change in status, stable for surgery.  I have reviewed the patient's chart and labs.  Questions were answered to the patient's satisfaction.     Starkeisha Vanwinkle,STEVEN R

## 2014-01-10 NOTE — Interval H&P Note (Signed)
History and Physical Interval Note:  01/10/2014 2:40 PM  [name]  has presented today for surgery, with the diagnosis of RIGHT CLAVICAL FRACTURE  The various methods of treatment have been discussed with the patient and family. After consideration of risks, benefits and other options for treatment, the patient has consented to  Procedure(s): OPEN REDUCTION INTERNAL FIXATION (ORIF) RIGHT  CLAVICULAR FRACTURE (Right) as a surgical intervention .  The patient's history has been reviewed, patient examined, no change in status, stable for surgery.  I have reviewed the patient's chart and labs.  Questions were answered to the patient's satisfaction.     Lashon Beringer,STEVEN R

## 2014-01-10 NOTE — Anesthesia Postprocedure Evaluation (Signed)
  Anesthesia Post-op Note  Patient: [name]  Procedure(s) Performed: Procedure(s): OPEN REDUCTION INTERNAL FIXATION (ORIF) RIGHT  CLAVICULAR FRACTURE (Right)  Patient Location: PACU  Anesthesia Type:GA combined with regional for post-op pain  Level of Consciousness: awake, alert , oriented and patient cooperative  Airway and Oxygen Therapy: Patient Spontanous Breathing  Post-op Pain: none  Post-op Assessment: Post-op Vital signs reviewed, Patient's Cardiovascular Status Stable, Respiratory Function Stable, Patent Airway, No signs of Nausea or vomiting and Pain level controlled  Post-op Vital Signs: Reviewed and stable  Last Vitals:  Filed Vitals:   01/10/14 1800  BP: 140/86  Pulse: 81  Temp:   Resp: 13    Complications: No apparent anesthesia complications

## 2014-01-10 NOTE — Progress Notes (Signed)
This note also relates to the following rows which could not be included: Pulse Rate - Cannot attach notes to unvalidated device data ECG Heart Rate - Cannot attach notes to unvalidated device data Resp - Cannot attach notes to unvalidated device data SpO2 - Cannot attach notes to unvalidated device data When asked if his pain is tolerale or managable, he replies "Oh yeah".

## 2014-01-10 NOTE — Anesthesia Preprocedure Evaluation (Addendum)
Anesthesia Evaluation  Patient identified by MRN, date of birth, ID band Patient awake    Reviewed: Allergy & Precautions, H&P , NPO status , Patient's Chart, lab work & pertinent test results  Airway Mallampati: II      Dental   Pulmonary neg pulmonary ROS,  breath sounds clear to auscultation        Cardiovascular hypertension, + CAD Rhythm:Regular Rate:Normal     Neuro/Psych    GI/Hepatic Neg liver ROS, GERD-  ,  Endo/Other    Renal/GU negative Renal ROS     Musculoskeletal   Abdominal   Peds  Hematology   Anesthesia Other Findings   Reproductive/Obstetrics                        Anesthesia Physical Anesthesia Plan  ASA: III  Anesthesia Plan: General   Post-op Pain Management:    Induction: Intravenous  Airway Management Planned: Oral ETT  Additional Equipment:   Intra-op Plan:   Post-operative Plan: Extubation in OR  Informed Consent: I have reviewed the patients History and Physical, chart, labs and discussed the procedure including the risks, benefits and alternatives for the proposed anesthesia with the patient or authorized representative who has indicated his/her understanding and acceptance.   Dental advisory given  Plan Discussed with: CRNA and Anesthesiologist  Anesthesia Plan Comments:         Anesthesia Quick Evaluation

## 2014-01-10 NOTE — Discharge Instructions (Signed)
Ice to the shoulder at all times.  No lifting, pushing or pulling with the right arm.  Keep the right arm in the sling at all times while up and at night.  May remove the sling if awake and alert and seated.  Ok to slide the right hand along the thigh, lap slides and actively move the right wrist and elbow.  Keep the dressing intact until Monday, then ok to change to BandAids.  Do not get wet in the shower until One week from surgery.  Follow up with Dr Ranell Patrick in the office in two weeks  [phone]

## 2014-01-10 NOTE — Interval H&P Note (Signed)
History and Physical Interval Note:  01/10/2014 2:41 PM  [name]  has presented today for surgery, with the diagnosis of RIGHT CLAVICAL FRACTURE  The various methods of treatment have been discussed with the patient and family. After consideration of risks, benefits and other options for treatment, the patient has consented to  Procedure(s): OPEN REDUCTION INTERNAL FIXATION (ORIF) RIGHT  CLAVICULAR FRACTURE (Right) as a surgical intervention .  The patient's history has been reviewed, patient examined, no change in status, stable for surgery.  I have reviewed the patient's chart and labs.  Questions were answered to the patient's satisfaction.     Maxfield Gildersleeve,STEVEN R

## 2014-01-10 NOTE — Transfer of Care (Signed)
Immediate Anesthesia Transfer of Care Note  Patient: [name]  Procedure(s) Performed: Procedure(s): OPEN REDUCTION INTERNAL FIXATION (ORIF) RIGHT  CLAVICULAR FRACTURE (Right)  Patient Location: PACU  Anesthesia Type:General  Level of Consciousness: sedated  Airway & Oxygen Therapy: Patient Spontanous Breathing and Patient connected to nasal cannula oxygen  Post-op Assessment: Report given to PACU RN and Post -op Vital signs reviewed and stable  Post vital signs: stable  Complications: No apparent anesthesia complications

## 2014-01-10 NOTE — Anesthesia Procedure Notes (Signed)
Procedure Name: Intubation Date/Time: 01/10/2014 3:21 PM Performed by: Jerilee Hoh Pre-anesthesia Checklist: Patient identified, Emergency Drugs available, Suction available and Patient being monitored Patient Re-evaluated:Patient Re-evaluated prior to inductionOxygen Delivery Method: Circle system utilized Preoxygenation: Pre-oxygenation with 100% oxygen Intubation Type: IV induction Ventilation: Mask ventilation with difficulty and Two handed mask ventilation required Tube type: Oral Tube size: 7.5 mm Number of attempts: 1 Airway Equipment and Method: Video-laryngoscopy and Stylet Placement Confirmation: ETT inserted through vocal cords under direct vision,  positive ETCO2 and breath sounds checked- equal and bilateral Secured at: 23 cm Tube secured with: Tape Dental Injury: Teeth and Oropharynx as per pre-operative assessment  Difficulty Due To: Difficulty was anticipated and Difficult Airway- due to limited oral opening Comments: Two handed mask ventilation. DL with Glidescope d/t short TM distance, and small oral opening. Good view with Glidescope. Atraumatic oral intubation.

## 2014-01-11 NOTE — Op Note (Signed)
[name], [name]              ACCOUNT NO.:  [id number]  MEDICAL RECORD NO.:  [id number]  LOCATION:  [facility]                         FACILITY:  [facility]  PHYSICIAN:  Almedia Balls. Ranell Patrick, M.D. DATE OF BIRTH:  01-Jan-1951  DATE OF PROCEDURE:  01/10/2014 DATE OF DISCHARGE:  01/10/2014                              OPERATIVE REPORT   PREOPERATIVE DIAGNOSIS:  Displaced and comminuted right clavicle fracture.  POSTOPERATIVE DIAGNOSIS:  Displaced and comminuted right clavicle fracture.  PROCEDURE PERFORMED:  Open reduction and internal fixation of displaced right clavicle fracture using DePuy clavicle pin.  ATTENDING SURGEON:  Almedia Balls. Ranell Patrick, MD.  ASSISTANT:  Donnie Coffin. Dixon, PA-C, who scrubbed the entire procedure and necessary for satisfactory completion of surgery.  ANESTHESIA:  General anesthesia was used plus local.  ESTIMATED BLOOD LOSS:  Minimal.  FLUID REPLACEMENT:  1000 mL crystalloid.  INSTRUMENT COUNTS:  Correct.  COMPLICATIONS:  None.  ANTIBIOTICS:  Perioperative antibiotics were given.  INDICATIONS:  The patient is a 63 year old male who fell off the tractor landing on his right shoulder.  The patient was found with immediate deformity and pain in his shoulder with tenting of the skin, presented to Orthopedics with an obviously displaced and comminuted mid shaft clavicle fracture with significant shortening and also tenting of the skin.  We discussed options for management.  Due to extreme displacement, the comminution, and the shortening, we recommended surgical management.  The patient agreed.  Risks and benefits were discussed.  Informed consent obtained.  DESCRIPTION OF PROCEDURE:  After an adequate level of anesthesia achieved, the patient was positioned in the modified beach-chair position.  Right shoulder was correctly identified, sterilely prepped draped in usual manner.  Time-out was called.  C-arm was draped into the field.  Langer's line skin incision  was created over the fracture site. Dissection down through subcutaneous tissues, identified the trapezius and platysma muscle and then divided in line with the muscle encountering the fractured clavicle.  We did subperiosteal dissection and then were able to place a crab claw on the proximal fragment.  We drilled that with a 3.2 DePuy drill bit and then tapped with a 3.0 clavicle pin tap.  Next, we identified and freed up the distal clavicle fragment.  There were several comminuted fragments, and we retrieved one and morselized it for placement at the end.  The other one, we were able to turn back into its anatomic position to leave it to the attached muscle anteriorly.  We drilled out the lateral fragment and then tapped it with a 3.0 clavicle pin tap and then retrograded the 3.0 clavicle pin out the posterolateral clavicle retrieving it through a separate stab incision posteriorly.  We retrograded that clavicle pin tucking it into that lateral fragment, reduced the fracture and then advanced the pin across the fracture site.  We went ahead and cold welded the medial and lateral nuts at the appropriate length and then cut off the remaining pin, rasped that smooth with a Cobb elevator handle and then advanced the pin the remainder of the way.  We had excellent purchase and alignment of the fracture.  It looked anatomic on x-ray.  We rotated that bone piece back  in place and took 0 Vicryl suture and wrapped that around the entire construct which stabilized that bone fragment.  We thoroughly irrigated the wound, closed the muscular layer with 0 Vicryl suture, followed by 2-0 Vicryl subcutaneous closure and 4-0 Monocryl for skin.  Steri-Strips were applied with sterile dressing and a shoulder sling.  The patient tolerated the procedure well.     Almedia Balls. Ranell Patrick, M.D.     SRN/MEDQ  D:  01/10/2014  T:  01/11/2014  Job:  [id number]

## 2014-01-13 ENCOUNTER — Encounter (HOSPITAL_COMMUNITY): Payer: Self-pay | Admitting: Orthopedic Surgery

## 2014-04-10 ENCOUNTER — Encounter (HOSPITAL_COMMUNITY): Payer: Self-pay | Admitting: Cardiovascular Disease

## 2014-05-15 ENCOUNTER — Encounter (HOSPITAL_COMMUNITY): Payer: Self-pay | Admitting: Cardiovascular Disease

## 2014-05-19 ENCOUNTER — Other Ambulatory Visit: Payer: Self-pay | Admitting: Cardiology

## 2014-05-19 NOTE — Telephone Encounter (Signed)
Rx(s) sent to pharmacy electronically. Last OV 04/2013 

## 2014-05-26 ENCOUNTER — Telehealth: Payer: Self-pay | Admitting: Cardiology

## 2014-05-26 NOTE — Telephone Encounter (Signed)
Close encounter 

## 2014-05-27 ENCOUNTER — Telehealth: Payer: Self-pay | Admitting: Cardiology

## 2014-05-27 NOTE — Telephone Encounter (Signed)
Close encounter 

## 2014-06-06 ENCOUNTER — Ambulatory Visit (INDEPENDENT_AMBULATORY_CARE_PROVIDER_SITE_OTHER): Payer: Medicare Other | Admitting: Cardiology

## 2014-06-06 VITALS — BP 116/78 | HR 71 | Ht 68.0 in | Wt 183.6 lb

## 2014-06-06 DIAGNOSIS — I2510 Atherosclerotic heart disease of native coronary artery without angina pectoris: Secondary | ICD-10-CM

## 2014-06-06 DIAGNOSIS — I10 Essential (primary) hypertension: Secondary | ICD-10-CM

## 2014-06-06 DIAGNOSIS — Z9861 Coronary angioplasty status: Secondary | ICD-10-CM

## 2014-06-06 DIAGNOSIS — Z8679 Personal history of other diseases of the circulatory system: Secondary | ICD-10-CM

## 2014-06-06 DIAGNOSIS — K219 Gastro-esophageal reflux disease without esophagitis: Secondary | ICD-10-CM

## 2014-06-06 DIAGNOSIS — R55 Syncope and collapse: Secondary | ICD-10-CM

## 2014-06-06 DIAGNOSIS — E785 Hyperlipidemia, unspecified: Secondary | ICD-10-CM

## 2014-06-06 MED ORDER — PRAVASTATIN SODIUM 80 MG PO TABS
80.0000 mg | ORAL_TABLET | Freq: Every day | ORAL | Status: DC
Start: 2014-06-06 — End: 2017-02-06

## 2014-06-06 MED ORDER — METOPROLOL SUCCINATE ER 25 MG PO TB24: 25.0000 mg | ORAL_TABLET | Freq: Every day | ORAL | Status: DC

## 2014-06-06 MED ORDER — NITROGLYCERIN 0.4 MG SL SUBL: 0.4000 mg | SUBLINGUAL_TABLET | SUBLINGUAL | Status: DC | PRN

## 2014-06-06 MED ORDER — CLOPIDOGREL BISULFATE 75 MG PO TABS: 75.0000 mg | ORAL_TABLET | Freq: Every morning | ORAL | Status: DC

## 2014-06-06 MED ORDER — RAMIPRIL 5 MG PO CAPS: 5.0000 mg | ORAL_CAPSULE | Freq: Every day | ORAL | Status: DC

## 2014-06-06 NOTE — Patient Instructions (Signed)
Your physician wants you to follow-up in: ONE YEAR with Dr. Harding. You will receive a reminder letter in the mail two months in advance. If you don't receive a letter, please call our office to schedule the follow-up appointment.  

## 2014-06-08 ENCOUNTER — Encounter: Payer: Self-pay | Admitting: Cardiology

## 2014-06-08 DIAGNOSIS — K219 Gastro-esophageal reflux disease without esophagitis: Secondary | ICD-10-CM | POA: Insufficient documentation

## 2014-06-08 NOTE — Assessment & Plan Note (Signed)
No recurrent palpitations or rapid heartbeat. He is on Toprol 25 mg.  Nothing was found on his loop recorder

## 2014-06-08 NOTE — Assessment & Plan Note (Signed)
Excellent control blood pressure with ACE inhibitor and beta blocker.

## 2014-06-08 NOTE — Assessment & Plan Note (Signed)
His labs are reportedly been checked by PCP. I have not seen results lately. I would hope that we could get some results from Dr. Mayford KnifeWilliams' office. He remains on statin but no myalgias.

## 2014-06-08 NOTE — Progress Notes (Signed)
PCP: [name] EdisonWILLIAMS,[name], MD  Clinic Note: Chief Complaint  Patient presents with  . Annual Exam    patient reports some chest discomfort which he feels is acid refelux    HPI: [name] is a 64 y.o. male with a PMH below who presents today for a long overdue clinic visit. He was originally scheduled to see me in the spring of 2015, but somehow this did not happen. In 2014 he had a loop recorder placed to evaluate syncope. Nothing was found. He does have a history of PSVT as well as coronary disease with LAD stent. There is a patent on 2 repeat catheterizations.  Past Medical History  Diagnosis Date  . CAD S/P percutaneous coronary angioplasty 2005    s/p PCI of LAD 3.0x6412mm Promus DES; ReCath in 07/2010 & 12/2012: Widely patent LAD stent with minimal disease elsewhere, Normal EF ; negative Myoview in January 2014  . GERD (gastroesophageal reflux disease)   . Seizures     in teenage year's  . History of PSVT (paroxysmal supraventricular tachycardia)   . History of syncope Jan 2014    s/p Loop Recorder - no positive etiology noted.  . Borderline diabetes   . Essential hypertension   . Dyslipidemia   . Right clavicle fracture   . Arthritis     Prior Cardiac Evaluation and Past Surgical History: Past Surgical History  Procedure Laterality Date  . Hernia repair  2005  . Cardiac catheterization  2005    noncritical CAD, aneursym in prox LAD, normal LV systolic function  . Cardiac catheterization  2/28//2012    3.0x4512mm Promus DES to LAD (repeat cath 07/2010 by Dr. Julieanne MansonAlfred Little showed patency of stent)  . Loop recorder implant  05/2012    Medtronic Reveal XT - placed for near-syncopal episodes by Dr. Judie PetitM. Croitoru  . Transthoracic echocardiogram  05/2012    EF 55-60%, normal wall motion, no valvular abnormalities - ordered for syncope  . Cardiac catheterization  12/2012    widely patent LAD stent; minimal CAD elsewhere; patulous LAD  . Colonoscopy w/ biopsies and polypectomy    .  Orif clavicular fracture Right 01/10/2014    Procedure: OPEN REDUCTION INTERNAL FIXATION (ORIF) RIGHT  CLAVICULAR FRACTURE;  Surgeon: Verlee RossettiSteven R Norris, MD;  [facility];  Service: Orthopedics;  Laterality: Right;  . Loop recorder implant N/A 05/11/2012    Procedure: LOOP RECORDER IMPLANT;  Surgeon: Thurmon FairMihai Croitoru, MD;  Location: [facility] CATH LAB;  Service: Cardiovascular;  Laterality: N/A;  . Left heart catheterization with coronary angiogram N/A 01/24/2013    [name] Lexavid W Makaylie Dedeaux, MD;  [facility];  Service: Cardiovascular;: Widely patent LAD stent, minmal CAD elsewhere, normal EF  . Loop recorder explant N/A 03/25/2013    Procedure: LOOP RECORDER EXPLANT;  Surgeon: Thurmon FairMihai Croitoru, MD;  Location: [facility] CATH LAB;  Service: Cardiovascular;  Laterality: N/A;  . Nm myoview ltd  05/2012    Normal Stress Test - No ischemia or Infarction, EF 62%   Interval History: He presents today without any major complaints. He had a fall back in September and her shoulder. He is having ORIF with a pin still remaining toes shoulder placed.  As a result of this accident he missed his followup appointment. He is noticing some intermittent GERD type symptoms that he has been adjusting his PPI dose 4. Otherwise he has not had any significant cardiac symptoms.  He denies chest pain or shortness of breath with rest or exertion. No PND, orthopnea  or edema. No palpitations, lightheadedness, dizziness, weakness, syncope/near syncope, or TIA/amaurosis fugax symptoms.  ROS: A comprehensive was performed. ROS  Current Outpatient Prescriptions on File Prior to Visit  Medication Sig Dispense Refill  . aspirin 81 MG tablet Take 162 mg by mouth every morning.     . Cholecalciferol (VITAMIN D-3) 1000 UNITS CAPS Take 1,000 Units by mouth daily.     Marland Kitchen dexlansoprazole (DEXILANT) 60 MG capsule Take 60 mg by mouth daily.    . methocarbamol (ROBAXIN) 500 MG tablet Take 1 tablet (500 mg total) by mouth 3 (three) times daily as needed. 60  tablet 1   No current facility-administered medications on file prior to visit.   No Known Allergies   Current Outpatient Prescriptions on File Prior to Visit  Medication Sig Dispense Refill  . aspirin 81 MG tablet Take 162 mg by mouth every morning.     . Cholecalciferol (VITAMIN D-3) 1000 UNITS CAPS Take 1,000 Units by mouth daily.     Marland Kitchen dexlansoprazole (DEXILANT) 60 MG capsule Take 60 mg by mouth daily.    . methocarbamol (ROBAXIN) 500 MG tablet Take 1 tablet (500 mg total) by mouth 3 (three) times daily as needed. 60 tablet 1   No current facility-administered medications on file prior to visit.   SOCIAL AND FAMILY HISTORY REVIEWED IN EPIC -- No change  Wt Readings from Last 3 Encounters:  06/06/14 183 lb 9.6 oz (83.28 kg)  01/10/14 176 lb (79.833 kg)  01/07/14 176 lb (79.833 kg)    PHYSICAL EXAM BP 116/78 mmHg  Pulse 71  Ht  (1.727 m)  Wt 183 lb 9.6 oz (83.28 kg)  BMI 27.92 kg/m2 General appearance: A&O x 3, cooperative, appears stated age, no distress and borderline obese Neck: no adenopathy, no carotid bruit and no JVD Lungs: CTAB,  normal percussion bilaterally and non-labored Heart:RRR , S1& S2 normal, no murmur, click, rub or gallop; nondisplaced PMI Abdomen: soft, non-tender; bowel sounds normal; no masses,  no HSM or HJR Extremities: extremities normal, atraumatic, no cyanosis, or edema Pulses: 2+ and symmetric;  Skin: normal  Neurologic: Mental status: Alert, oriented, thought content appropriate; Cranial nerves: normal (II-XII grossly intact)   Adult ECG Report  Rate: 71 ;  Rhythm: normal sinus rhythm and ~leftward axis of -14.  Narrative Interpretation: Essentially normal EKG  Recent Labs:  Non-available  ASSESSMENT / PLAN: CAD S/P percutaneous coronary angioplasty No recurrent anginal symptoms. Remains on aspirin plus Plavix. We can stop aspirin at this time since he is having GERD episodes. Continue ACE inhibitor, beta blocker and  statin.   Hyperlipidemia with target LDL less than 70 His labs are reportedly been checked by PCP. I have not seen results lately. I would hope that we could get some results from Dr. Mayford Knife' office. He remains on statin but no myalgias.   Near syncope - s/p Loop Recorder implant, now removed 03/26/13  No further episodes of syncope or near syncope.   History of PSVT (paroxysmal supraventricular tachycardia) No recurrent palpitations or rapid heartbeat. He is on Toprol 25 mg.  Nothing was found on his loop recorder   Essential hypertension Excellent control blood pressure with ACE inhibitor and beta blocker.     No orders of the defined types were placed in this encounter.   Meds ordered this encounter  Medications  . ramipril (ALTACE) 5 MG capsule    Sig: Take 1 capsule (5 mg total) by mouth daily.    Dispense:  30  capsule    Refill:  11  . pravastatin (PRAVACHOL) 80 MG tablet    Sig: Take 1 tablet (80 mg total) by mouth at bedtime.    Dispense:  30 tablet    Refill:  11  . nitroGLYCERIN (NITROSTAT) 0.4 MG SL tablet    Sig: Place 1 tablet (0.4 mg total) under the tongue every 5 (five) minutes as needed. For chest pain    Dispense:  25 tablet    Refill:  1  . metoprolol succinate (TOPROL-XL) 25 MG 24 hr tablet    Sig: Take 1 tablet (25 mg total) by mouth daily.    Dispense:  30 tablet    Refill:  11  . clopidogrel (PLAVIX) 75 MG tablet    Sig: Take 1 tablet (75 mg total) by mouth every morning.    Dispense:  30 tablet    Refill:  11    Followup: 1 yr   Gaylin Bulthuis, Piedad Climes, M.D., M.S. Interventional Cardiologist   Pager # [phone]

## 2014-06-08 NOTE — Assessment & Plan Note (Addendum)
No further episodes of syncope or near syncope. 

## 2014-06-08 NOTE — Assessment & Plan Note (Signed)
No recurrent anginal symptoms. Remains on aspirin plus Plavix. We can stop aspirin at this time since he is having GERD episodes. Continue ACE inhibitor, beta blocker and statin.

## 2014-06-10 NOTE — Addendum Note (Signed)
Addended byMarella Bile: Lena Fieldhouse W. on: 06/10/2014 10:27 AM   Modules accepted: Orders

## 2014-06-12 ENCOUNTER — Encounter: Payer: Self-pay | Admitting: Cardiology

## 2014-06-13 NOTE — Progress Notes (Signed)
Quick Note:  LIPID PANEL: 06/10/2014  TC 126, TG 80, HDL 46, VLDL 16, LDL 64 --> these numbers look great.  CHEM-10: Na+ 140, K+ 4.5, Cl- 101, HCO3- 24, BUN 16, Cr 1.11, Glu 113 (borderline), Ca2+ 9.6;; AST/ALT 16/19, Alk Phos 94, Albumin 4.4, TProtein 7.0, T Globulin 2.6 -- all look pretty good Hgb A1c 6.4% ==> this goes along with the Glucose of 113 as Borderline Diabetes. Will need to discuss with PCP. Decrease starchy foods & increase exercise.  Leonie Man, MD      Specimen Collected: 02/14/14      ______

## 2014-06-18 ENCOUNTER — Telehealth: Payer: Self-pay | Admitting: *Deleted

## 2014-06-18 NOTE — Telephone Encounter (Signed)
PATIENT RETURNING CALL Spoke to patient. Result given . Verbalized understanding

## 2014-06-18 NOTE — Telephone Encounter (Signed)
-----  Message from Leonie Man, MD sent at 06/13/2014  5:38 AM EST ----- LIPID PANEL: 06/10/2014  TC 126, TG 80, HDL 46, VLDL 16, LDL 64 --> these numbers look great.  CHEM-10: Na+ 140, K+ 4.5, Cl- 101, HCO3- 24, BUN 16, Cr 1.11, Glu 113 (borderline), Ca2+ 9.6;; AST/ALT 16/19, Alk Phos 94, Albumin 4.4, TProtein 7.0, T Globulin 2.6 -- all look pretty good Hgb A1c 6.4% ==> this goes along with the Glucose of 113 as Borderline Diabetes.  Will need to discuss with PCP.  Decrease starchy foods & increase exercise.  Leonie Man, MD

## 2014-06-18 NOTE — Telephone Encounter (Signed)
Left message to call back  

## 2014-07-02 ENCOUNTER — Encounter: Payer: Self-pay | Admitting: *Deleted

## 2014-07-08 ENCOUNTER — Telehealth: Payer: Self-pay | Admitting: Cardiovascular Disease

## 2014-07-08 NOTE — Telephone Encounter (Signed)
New message      Pt got letter saying he has not been transmitting---his device was removed a long time ago

## 2014-07-08 NOTE — Telephone Encounter (Signed)
LM w/wife for pt TCB//sss

## 2014-07-10 NOTE — Telephone Encounter (Signed)
Informed wife that pt has been marked inactive in [employer]. Wife voiced understanding.

## 2014-08-07 ENCOUNTER — Ambulatory Visit: Payer: Self-pay | Admitting: Cardiology

## 2014-12-08 ENCOUNTER — Other Ambulatory Visit: Payer: Self-pay | Admitting: *Deleted

## 2014-12-08 MED ORDER — RAMIPRIL 5 MG PO CAPS: 5.0000 mg | ORAL_CAPSULE | Freq: Every day | ORAL | Status: DC

## 2014-12-08 NOTE — Telephone Encounter (Signed)
Rx(s) sent to pharmacy electronically.  

## 2015-05-19 ENCOUNTER — Other Ambulatory Visit: Payer: Self-pay | Admitting: Cardiology

## 2015-05-20 NOTE — Telephone Encounter (Signed)
Rx request sent to pharmacy.  

## 2015-06-19 ENCOUNTER — Other Ambulatory Visit: Payer: Self-pay | Admitting: Cardiology

## 2015-06-19 NOTE — Telephone Encounter (Signed)
Rx(s) sent to pharmacy electronically.  

## 2015-07-01 ENCOUNTER — Ambulatory Visit (INDEPENDENT_AMBULATORY_CARE_PROVIDER_SITE_OTHER): Payer: Medicare Other | Admitting: Cardiology

## 2015-07-01 ENCOUNTER — Encounter: Payer: Self-pay | Admitting: Cardiology

## 2015-07-01 VITALS — BP 118/78 | HR 90 | Ht 68.0 in | Wt 183.0 lb

## 2015-07-01 DIAGNOSIS — I10 Essential (primary) hypertension: Secondary | ICD-10-CM | POA: Diagnosis not present

## 2015-07-01 DIAGNOSIS — Z8679 Personal history of other diseases of the circulatory system: Secondary | ICD-10-CM | POA: Diagnosis not present

## 2015-07-01 DIAGNOSIS — Z9861 Coronary angioplasty status: Secondary | ICD-10-CM

## 2015-07-01 DIAGNOSIS — E785 Hyperlipidemia, unspecified: Secondary | ICD-10-CM

## 2015-07-01 DIAGNOSIS — I2510 Atherosclerotic heart disease of native coronary artery without angina pectoris: Secondary | ICD-10-CM | POA: Diagnosis not present

## 2015-07-01 NOTE — Progress Notes (Signed)
PCP: Ulanda Edison, MD  Clinic Note: Chief Complaint  Patient presents with  . Shortness of Breath  . Coronary Artery Disease  . Tachycardia    History of SVT    HPI: [name] is a 65 y.o. male with a PMH below who presents [name] for ~1 yr f/u of CAD (DES to LAD in 2005). He was originally scheduled to see me in the spring of 2015, but somehow this did not happen. In 2014 he had a loop recorder placed to evaluate syncope. Nothing was found. He does have a history of PSVT as well as coronary disease with LAD stent. These have been  patent on 2 repeat catheterizations. He also has had h/o tachycardiac episodes - usually triggered by caffeine -- never found anything on Loop recorder.  [name] was last seen in Feb 2016.   Doing well  Recent Hospitalizations: None  Studies Reviewed: None; Last Nuc - 2014  Interval History: [name] doing pretty well. He is not had any significant anginal symptoms to speak of at either rest or exertion. He does get a little bit dyspneic if he walks up a hill or up a lot of stairs. He said he recently went hunting with friends & fot sob walking up a hill.   Otherwise, if he really pushes it, he will get winded.  He is concerned that he is not getting enough "Cardio Exercise" -- is working all day on the farm.   He says that his rapid heartbeat episodes of becoming much less notable. He does note occasional "skipping beats", but no more rapid HR episodes. Along with his concern about cardio exercise, he says that his HR goes up faster than before with exercise.    Otherwise he is doing well from a cardiac standpoint.  Cardiovascular review of symptoms: No chest pain or pressure consistent with angina with rest or exertion.  No PND, orthopnea or edema.  No lightheadedness, dizziness, weakness or syncope/near syncope. No TIA/amaurosis fugax symptoms. No claudication.  ROS: A comprehensive was performed. Review of Systems    Constitutional: Negative for weight loss and malaise/fatigue.  HENT: Negative for congestion and nosebleeds.   Respiratory: Negative for cough.   Cardiovascular:       Per history of present illness  Gastrointestinal: Negative for blood in stool and melena.  Genitourinary: Negative for hematuria.  Musculoskeletal: Negative for joint pain and falls.  Neurological: Negative for dizziness and headaches.  Endo/Heme/Allergies: Does not bruise/bleed easily.  All other systems reviewed and are negative.    Past Medical History  Diagnosis Date  . CAD S/P percutaneous coronary angioplasty 2005    s/p PCI of LAD 3.0x65mm Promus DES; ReCath in 07/2010 & 12/2012: Widely patent LAD stent with minimal disease elsewhere, Normal EF ; negative Myoview in January 2014  . GERD (gastroesophageal reflux disease)   . Seizures (HCC)     in teenage year's  . History of PSVT (paroxysmal supraventricular tachycardia)   . History of syncope Jan 2014    s/p Loop Recorder - no positive etiology noted.  . Borderline diabetes   . Essential hypertension   . Dyslipidemia   . Right clavicle fracture   . Arthritis     Past Surgical History  Procedure Laterality Date  . Hernia repair  2005  . Cardiac catheterization  2005    noncritical CAD, aneursym in prox LAD, normal LV systolic function  . Cardiac catheterization  2/28//2012    3.0x80mm Promus  DES to LAD (repeat cath 07/2010 by Dr. Julieanne Manson showed patency of stent)  . Loop recorder implant  05/2012    Medtronic Reveal XT - placed for near-syncopal episodes by Dr. Judie Petit. Croitoru  . Transthoracic echocardiogram  05/2012    EF 55-60%, normal wall motion, no valvular abnormalities - ordered for syncope  . Cardiac catheterization  12/2012    widely patent LAD stent; minimal CAD elsewhere; patulous LAD  . Colonoscopy w/ biopsies and polypectomy    . Orif clavicular fracture Right 01/10/2014    Procedure: OPEN REDUCTION INTERNAL FIXATION (ORIF) RIGHT  CLAVICULAR  FRACTURE;  Surgeon: Verlee Rossetti, MD;  Location[facility] OR;  Service: Orthopedics;  Laterality: Right;  . Loop recorder implant N/A 05/11/2012    Procedure: LOOP RECORDER IMPLANT;  Surgeon: Thurmon Fair, MD;  Location: [facility] CATH LAB;  Service: Cardiovascular;  Laterality: N/A;  . Left heart catheterization with coronary angiogram N/A 01/24/2013    Marykay Lex, MD;  Location: [facility] CATH LAB;  Service: Cardiovascular;: Widely patent LAD stent, minmal CAD elsewhere, normal EF  . Loop recorder explant N/A 03/25/2013    Procedure: LOOP RECORDER EXPLANT;  Surgeon: Thurmon Fair, MD;  Location: [facility] CATH LAB;  Service: Cardiovascular;  Laterality: N/A;  . Nm myoview ltd  05/2012    Normal Stress Test - No ischemia or Infarction, EF 62%   Prior to Admission medications   Medication Sig Start Date End Date Taking? Authorizing Provider  Cholecalciferol (VITAMIN D-3) 1000 UNITS CAPS Take 1,000 Units by mouth once a week.    Yes Historical Provider, MD  clopidogrel (PLAVIX) 75 MG tablet Take 1 tablet (75 mg total) by mouth every morning. MUST KEEP APPOINTMENT 07/01/2015 WITH DR Herbie Baltimore FOR FUTURE REFILLS 06/19/15  Yes Marykay Lex, MD  DEXILANT 60 MG capsule TAKE ONE CAPSULE BY MOUTH EVERY DAY 05/20/15  Yes Marykay Lex, MD  metoprolol succinate (TOPROL-XL) 25 MG 24 hr tablet Take 1 tablet (25 mg total) by mouth daily. MUST KEEP APPOINTMENT 07/01/2015 WITH DR Herbie Baltimore FOR FUTURE REFILLS 06/19/15  Yes Marykay Lex, MD  nitroGLYCERIN (NITROSTAT) 0.4 MG SL tablet Place 1 tablet (0.4 mg total) under the tongue every 5 (five) minutes as needed. For chest pain 06/06/14  Yes Marykay Lex, MD  pravastatin (PRAVACHOL) 80 MG tablet Take 1 tablet (80 mg total) by mouth at bedtime. 06/06/14  Yes Marykay Lex, MD  ramipril (ALTACE) 5 MG capsule Take 1 capsule (5 mg total) by mouth daily. 12/08/14  Yes Marykay Lex, MD   No Known Allergies   Social History   Social History  . Marital Status: Married    Spouse Name:  N/A  . Number of Children: 2  . Years of Education: N/A   Occupational History  . chicken farm    Social History Main Topics  . Smoking status: Never Smoker   . Smokeless tobacco: Never Used  . Alcohol Use: No  . Drug Use: No  . Sexual Activity: Yes   Other Topics Concern  . None   Social History Narrative    He is a married father of 2, grandfather of 4. Nonsmoker. 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 No alcohol.    Family History  Problem Relation Age of Onset  . Heart disease Mother   . Hypertension Mother   . Heart attack Father   .  Heart attack Brother   . Cancer Maternal Grandfather   . Stroke Paternal Grandmother   . Leukemia Maternal Grandfather     Wt Readings from Last 3 Encounters:  07/01/15 183 lb (83.008 kg)  06/06/14 183 lb 9.6 oz (83.28 kg)  01/10/14 176 lb (79.833 kg)    PHYSICAL EXAM BP 118/78 mmHg  Pulse 90  Ht  (1.727 m)  Wt 183 lb (83.008 kg)  BMI 27.83 kg/m2 General appearance: A&O x 3, cooperative, appears stated age, no distress; well-nourished well-groomed Neck: no adenopathy, no carotid bruit and no JVD HEENT: [place]/AT, EOMI, MMM, anicteric sclera Lungs: CTAB, normal percussion bilaterally and non-labored Heart:RRR , S1& S2 normal, no murmur, click, rub or gallop; nondisplaced PMI Abdomen: soft, non-tender; bowel sounds normal; no masses, no HSM or HJR Extremities: extremities normal, atraumatic, no cyanosis, or edema Pulses: 2+ and symmetric;  Skin: normal  Neurologic: Mental status: Alert, oriented, thought content appropriate; Cranial nerves: normal (II-XII grossly intact)    Adult ECG Report  Rate: 90 ;  Rhythm: normal sinus rhythm and Normal intervals and durations. (Borderline left axis -24);   Narrative Interpretation: Normal EKG. Stable   Other studies Reviewed: Additional studies/ records that were reviewed [name] include:  Recent Labs:   Otherwise followed by PCP - most recent check from February of this year  Lab Results  Component Value Date   CHOL 122    HDL 40    LDLCALC 65    TRIG 85      ASSESSMENT / PLAN: Problem List Items Addressed This Visit    Hyperlipidemia with target LDL less than 70 (Chronic)    Is tolerating pravastatin well. He says his lipids were very well controlled by his last check - he has a letter telling him the results. They are excellent.      Relevant Orders   EKG 12-Lead   History of PSVT (paroxysmal supraventricular tachycardia) (Chronic)    No recurrent rapid heart rate episodes. He doesn't palpitations. He currently is on Toprol 25 mg. If he has more of the sensation of his heart rate going up with exercise and is concerned, I would increase his Toprol to 50 mg. Thankfully when he wore his loop monitor back in 2014, there is no episodes.      Relevant Orders   EKG 12-Lead   Essential hypertension (Chronic)    Well-controlled on current dose of beta blocker and ACE inhibitor.      Relevant Orders   EKG 12-Lead   CAD S/P percutaneous coronary angioplasty - Primary (Chronic)    Doing well without any anginal symptoms. He has had 2 caths since his PCI with widely patent stents. Followed up by a negative Myoview. Remains on Plavix without aspirin. He takes Dexilant for his GERD and GI prophylaxis. He is on Toprol, rare frontal and pravastatin      Relevant Orders   EKG 12-Lead      Current medicines are reviewed at length with the patient [name]. (+/- concerns) none The following changes have been made: none Studies Ordered:   Orders Placed This Encounter  Procedures  . EKG 12-Lead    Follow-up in one year  Irlanda Croghan, Piedad Climes, M.D., M.S. Interventional Cardiologist   Pager # [phone] Phone # [phone] [address] [place], [place] [address]

## 2015-07-01 NOTE — Patient Instructions (Signed)
Continue same medications.   Your physician wants you to follow-up in: 1 year.  You will receive a reminder letter in the mail two months in advance. If you don't receive a letter, please call our office to schedule the follow-up appointment.  

## 2015-07-03 ENCOUNTER — Encounter: Payer: Self-pay | Admitting: Cardiology

## 2015-07-03 NOTE — Assessment & Plan Note (Signed)
Doing well without any anginal symptoms. He has had 2 caths since his PCI with widely patent stents. Followed up by a negative Myoview. Remains on Plavix without aspirin. He takes Dexilant for his GERD and GI prophylaxis. He is on Toprol, rare frontal and pravastatin

## 2015-07-03 NOTE — Assessment & Plan Note (Signed)
Well-controlled on current dose of beta blocker and ACE inhibitor. 

## 2015-07-03 NOTE — Assessment & Plan Note (Signed)
No recurrent rapid heart rate episodes. He doesn't palpitations. He currently is on Toprol 25 mg. If he has more of the sensation of his heart rate going up with exercise and is concerned, I would increase his Toprol to 50 mg. Thankfully when he wore his loop monitor back in 2014, there is no episodes.

## 2015-07-03 NOTE — Assessment & Plan Note (Signed)
Is tolerating pravastatin well. He says his lipids were very well controlled by his last check - he has a letter telling him the results. They are excellent.

## 2015-07-14 ENCOUNTER — Other Ambulatory Visit: Payer: Self-pay | Admitting: Cardiology

## 2015-07-14 NOTE — Telephone Encounter (Signed)
REFILL 

## 2015-12-25 ENCOUNTER — Other Ambulatory Visit: Payer: Self-pay | Admitting: Cardiology

## 2015-12-25 NOTE — Telephone Encounter (Signed)
Rx(s) sent to pharmacy electronically.  

## 2016-06-30 ENCOUNTER — Ambulatory Visit: Payer: Medicare Other | Admitting: Cardiology

## 2016-07-06 ENCOUNTER — Encounter: Payer: Self-pay | Admitting: Cardiology

## 2016-07-06 ENCOUNTER — Ambulatory Visit (INDEPENDENT_AMBULATORY_CARE_PROVIDER_SITE_OTHER): Payer: Medicare Other | Admitting: Cardiology

## 2016-07-06 VITALS — BP 118/64 | HR 71 | Ht 68.0 in | Wt 184.0 lb

## 2016-07-06 DIAGNOSIS — I2510 Atherosclerotic heart disease of native coronary artery without angina pectoris: Secondary | ICD-10-CM | POA: Diagnosis not present

## 2016-07-06 DIAGNOSIS — R55 Syncope and collapse: Secondary | ICD-10-CM | POA: Diagnosis not present

## 2016-07-06 DIAGNOSIS — E785 Hyperlipidemia, unspecified: Secondary | ICD-10-CM | POA: Diagnosis not present

## 2016-07-06 DIAGNOSIS — I10 Essential (primary) hypertension: Secondary | ICD-10-CM | POA: Diagnosis not present

## 2016-07-06 DIAGNOSIS — Z8679 Personal history of other diseases of the circulatory system: Secondary | ICD-10-CM | POA: Diagnosis not present

## 2016-07-06 DIAGNOSIS — Z9861 Coronary angioplasty status: Secondary | ICD-10-CM

## 2016-07-06 NOTE — Progress Notes (Signed)
PCP: [name],[name], MD'; [facility], [facility]  Clinic Note: Chief Complaint  Patient presents with  . Follow-up    no complaints  . Coronary Artery Disease  . Palpitations    HPI: [name] HamburgDennis A Simms is a 66 y.o. male with a PMH below who presents today for ~1 yr f/u of CAD (DES to LAD in 2005). He was originally scheduled to see me in the spring of 2015, but somehow this did not happen. In 2014 he had a loop recorder placed to evaluate syncope. Nothing was found. He does have a history of PSVT as well as coronary disease with LAD stent. These have been  patent on 2 repeat catheterizations. He also has had h/o tachycardiac episodes - usually triggered by caffeine -- never found anything on Loop recorder. [name].  Eryc A Zulauf was last seen Back in March 2017 he was doing well at that time without any symptoms. Usually getting short of breath walking up hills or stairs while hunting. He now is that he doesn't get enough cardio exercise. Much less notable rapid heartbeats.  Recent Hospitalizations: None  Studies Reviewed: None  Interval History: [name] presents here today for his annual follow-up doing quite well. He really has not noted any recurrent chest discomfort with rest or exertion. 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 He is also having occasional spells, albeit relatively short-lived of increasing bursts of rapid heartbeat. Nothing that seems to be long enough to make him lightheaded and dizzy, just a little bit apprehensive. Usually these things ease up with deep breathing and trying to relax himself. Interestingly, when he's active he doesn't notice these episodes are usually at rest.  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   No routine exercise. No  chest pain or shortness of breath with rest or exertion.  No PND, orthopnea or edema.  No lightheadedness, dizziness, weakness or syncope/near syncope. No TIA/amaurosis fugax symptoms. No melena, hematochezia, hematuria, or epstaxis. No claudication.  ROS: A comprehensive was performed. Review of Systems  Constitutional: Negative for malaise/fatigue and weight loss.  HENT: Negative for congestion, nosebleeds and sinus pain.   Respiratory: Negative for cough and wheezing.   Cardiovascular:       Per history of present illness  Gastrointestinal: Positive for abdominal pain, constipation and heartburn (still having issues - Dexilant became too expensive - back on Protonix -- not as potent).  Musculoskeletal: Negative for falls, joint pain and myalgias.  Neurological: Positive for dizziness (Occasionally when he feels his heart going fast). Negative for focal weakness.  Psychiatric/Behavioral: Negative for memory loss. The patient is not nervous/anxious and does not have insomnia.     Past Medical History:  Diagnosis Date  . Arthritis   . Borderline diabetes   . CAD S/P percutaneous coronary angioplasty 2005   s/p PCI of LAD 3.0x8812mm Promus DES; ReCath in 07/2010 & 12/2012: Widely patent LAD stent with minimal disease elsewhere, Normal EF ; negative Myoview in January 2014  . Dyslipidemia   . Essential hypertension   . GERD (gastroesophageal reflux disease)   . History of PSVT (paroxysmal supraventricular tachycardia)   . History of syncope Jan 2014   s/p Loop Recorder - no positive etiology noted.  . Right clavicle fracture   .  Seizures (HCC)    in teenage year's    Past Surgical History:  Procedure Laterality Date  . CARDIAC CATHETERIZATION  2005   noncritical CAD, aneursym in prox LAD, normal LV systolic function  . CARDIAC CATHETERIZATION  2/28//2012   3.0x49mm Promus DES to LAD (repeat cath 07/2010 by Dr. Julieanne Manson showed patency of stent)  . CARDIAC CATHETERIZATION   12/2012   widely patent LAD stent; minimal CAD elsewhere; patulous LAD  . COLONOSCOPY W/ BIOPSIES AND POLYPECTOMY    . HERNIA REPAIR  2005  . LEFT HEART CATHETERIZATION WITH CORONARY ANGIOGRAM N/A 01/24/2013   Marykay Lex, MD;  Location: [facility] CATH LAB;  Service: Cardiovascular;: Widely patent LAD stent, minmal CAD elsewhere, normal EF  . LOOP RECORDER EXPLANT N/A 03/25/2013   Procedure: LOOP RECORDER EXPLANT;  Surgeon: Thurmon Fair, MD;  Location: [facility] CATH LAB;  Service: Cardiovascular;  Laterality: N/A;  . LOOP RECORDER IMPLANT  05/2012   Medtronic Reveal XT - placed for near-syncopal episodes by Dr. Judie Petit. Croitoru  . LOOP RECORDER IMPLANT N/A 05/11/2012   Procedure: LOOP RECORDER IMPLANT;  Surgeon: Thurmon Fair, MD;  Location: [facility] CATH LAB;  Service: Cardiovascular;  Laterality: N/A;  . NM MYOVIEW LTD  05/2012   Normal Stress Test - No ischemia or Infarction, EF 62%  . ORIF CLAVICULAR FRACTURE Right 01/10/2014   Procedure: OPEN REDUCTION INTERNAL FIXATION (ORIF) RIGHT  CLAVICULAR FRACTURE;  Surgeon: Verlee Rossetti, MD;  Location: [facility] OR;  Service: Orthopedics;  Laterality: Right;  . TRANSTHORACIC ECHOCARDIOGRAM  05/2012   EF 55-60%, normal wall motion, no valvular abnormalities - ordered for syncope    Current Meds  Medication Sig  . Cholecalciferol (VITAMIN D-3) 1000 UNITS CAPS Take 1,000 Units by mouth once a week.   . clopidogrel (PLAVIX) 75 MG tablet TAKE ONE TABLET BY MOUTH EVERY MORNING  . metoprolol succinate (TOPROL-XL) 25 MG 24 hr tablet TAKE ONE TABLET BY MOUTH EVERY DAY  . nitroGLYCERIN (NITROSTAT) 0.4 MG SL tablet Place 1 tablet (0.4 mg total) under the tongue every 5 (five) minutes as needed. For chest pain  . pantoprazole (PROTONIX) 40 MG tablet Take 40 mg by mouth daily.  . pravastatin (PRAVACHOL) 80 MG tablet Take 1 tablet (80 mg total) by mouth at bedtime.  . ramipril (ALTACE) 5 MG capsule TAKE ONE CAPSULE BY MOUTH EVERY DAY    No Known Allergies  Social History    Social History  . Marital status: Married    Spouse name: N/A  . Number of children: 2  . Years of education: N/A   Occupational History  . chicken farm    Social History Main Topics  . Smoking status: Never Smoker  . Smokeless tobacco: Never Used  . Alcohol use No  . Drug use: No  . Sexual activity: Yes   Other Topics Concern  . None   Social History Narrative    He is a married father of 2, grandfather of 4. Nonsmoker. 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 No alcohol.     family history includes Cancer in his maternal grandfather; Heart attack in his brother and father; Heart disease in his mother; Hypertension in his mother; Leukemia in his maternal grandfather; Stroke in his paternal grandmother.  Wt Readings from Last 3 Encounters:  07/06/16 83.5 kg (184 lb)  07/01/15 83 kg (183 lb)  06/06/14 83.3 kg (183 lb 9.6  oz)    PHYSICAL EXAM BP 118/64 (BP Location: Right Arm, Patient Position: Sitting, Cuff Size: Normal)   Pulse 71   Ht 5\' 8"  (1.727 m)   Wt 83.5 kg (184 lb)   BMI 27.98 kg/m  General appearance: A&O x 3, cooperative, appears stated age, no distress; well-nourished well-groomed Neck: no adenopathy, no carotid bruit and no JVD HEENT: [place]/AT, EOMI, MMM, anicteric sclera Lungs: CTAB, normal percussion bilaterally and non-labored Heart:RRR , S1& S2 normal, no murmur, click, rub or gallop; nondisplaced PMI Abdomen: soft, non-tender; bowel sounds normal; no masses, no HSM or HJR Extremities: extremities normal, atraumatic, no cyanosis, or edema Pulses: 2+ and symmetric;  Skin: normal     Adult ECG Report  Rate: 71 ;  Rhythm: normal sinus rhythm and borderline LVH; otherwise normal axis, intervals & durations;   Narrative Interpretation: Essentially normal EKG  Other studies Reviewed: Additional studies/ records that were reviewed today include:  Recent Labs:  Just  checked by PCP. A1c 6.9 - has recheck scheduled in a few months.  Lipids doing better with 80 mg Pravastatin. (Labs not available)   ASSESSMENT / PLAN: Problem List Items Addressed This Visit    CAD S/P percutaneous coronary angioplasty - Primary (Chronic)    He has a decent size relatively short DES stent in the LAD. That site is in patent twice in follow-up catheterizations and he is also had a negative Myoview. No recurrent anginal symptoms or significant exertional dyspnea symptoms. He is on Plavix and as result when Dexilant became too expensive, he was switched to Protonix for his GERD.  On Toprol at a stable dose along with ramipril and pravastatin      Relevant Orders   EKG 12-Lead (Completed)   Essential hypertension (Chronic)   Relevant Orders   EKG 12-Lead (Completed)   History of PSVT (paroxysmal supraventricular tachycardia) (Chronic)    Usually well controlled with Toprol, however still having some short spells. We talked about vagal maneuvers. He also thought of potentially using additional Toprol to calm episodes down.      Relevant Orders   EKG 12-Lead (Completed)   Hyperlipidemia with target LDL less than 70 (Chronic)    Doing relatively well on pravastatin. Apparently there recently checked, and lipids were "at goal". I don't have the results of this point.      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 He still has those relatively rare episodes of rapid heartbeats that are very difficult as the never figured out that they were. If the symptoms do recur or he is having rapid heartbeats, I told him he can take an additional Toprol.         Current medicines are reviewed at length with the patient today. (+/- concerns) None The following changes have been made: None  Patient Instructions  NO CHANGES WITH CURRENT MEDICATIONS    WILL GET LABS FROM PRIMARY    Your physician  wants you to follow-up in 12 MONTHS WITH DR Ashari Llewellyn. You will receive a reminder letter in the mail two months in advance. If you don't receive a letter, please call our office to schedule the follow-up appointment.     If you need a refill on your cardiac medications before your next appointment, please call your pharmacy.        Studies Ordered:   Orders Placed This Encounter  Procedures  . EKG 12-Lead  Laban Orourke, M.D., M.S. Interventional Cardiologist   Pager # [phone] Phone # [phone] [address] [place], [place] [address] 

## 2016-07-06 NOTE — Patient Instructions (Signed)
NO CHANGES WITH CURRENT MEDICATIONS    WILL GET LABS FROM PRIMARY    Your physician wants you to follow-up in 12 MONTHS WITH DR HARDING. You will receive a reminder letter in the mail two months in advance. If you don't receive a letter, please call our office to schedule the follow-up appointment.     If you need a refill on your cardiac medications before your next appointment, please call your pharmacy.

## 2016-07-08 ENCOUNTER — Encounter: Payer: Self-pay | Admitting: Cardiology

## 2016-07-08 NOTE — Assessment & Plan Note (Signed)
He has a decent size relatively short DES stent in the LAD. That site is in patent twice in follow-up catheterizations and he is also had a negative Myoview. No recurrent anginal symptoms or significant exertional dyspnea symptoms. He is on Plavix and as result when Dexilant became too expensive, he was switched to Protonix for his GERD.  On Toprol at a stable dose along with ramipril and pravastatin

## 2016-07-08 NOTE — Assessment & Plan Note (Signed)
No further syncope or near syncopal episodes. He still has those relatively rare episodes of rapid heartbeats that are very difficult as the never figured out that they were. If the symptoms do recur or he is having rapid heartbeats, I told him he can take an additional Toprol.

## 2016-07-08 NOTE — Assessment & Plan Note (Signed)
Usually well controlled with Toprol, however still having some short spells. We talked about vagal maneuvers. He also thought of potentially using additional Toprol to calm episodes down.

## 2016-07-08 NOTE — Assessment & Plan Note (Signed)
Doing relatively well on pravastatin. Apparently there recently checked, and lipids were "at goal". I don't have the results of this point.

## 2016-07-25 ENCOUNTER — Other Ambulatory Visit: Payer: Self-pay | Admitting: Cardiology

## 2016-07-26 NOTE — Telephone Encounter (Signed)
REFILL 

## 2016-09-29 ENCOUNTER — Other Ambulatory Visit: Payer: Self-pay | Admitting: Cardiology

## 2016-09-29 NOTE — Telephone Encounter (Signed)
REFILL 

## 2016-12-19 ENCOUNTER — Other Ambulatory Visit: Payer: Self-pay | Admitting: *Deleted

## 2016-12-19 MED ORDER — RAMIPRIL 5 MG PO CAPS: 5.0000 mg | ORAL_CAPSULE | Freq: Every day | ORAL | 3 refills | Status: DC

## 2017-02-06 ENCOUNTER — Telehealth: Payer: Self-pay | Admitting: Cardiology

## 2017-02-06 MED ORDER — PRAVASTATIN SODIUM 80 MG PO TABS: 80.0000 mg | ORAL_TABLET | Freq: Every day | ORAL | 5 refills | Status: DC

## 2017-02-06 NOTE — Telephone Encounter (Signed)
Refill sent to requested pharmacy.

## 2017-02-06 NOTE — Telephone Encounter (Signed)
New message     *STAT* If patient is at the pharmacy, call can be transferred to refill team.   1. Which medications need to be refilled? (please list name of each medication and dose if known) pravastatin 80 mg  2. Which pharmacy/location (including street and city if local pharmacy) is medication to be sent to? [facility] in [place]   3. Do they need a 30 day or 90 day supply? 30 day

## 2017-07-07 ENCOUNTER — Encounter: Payer: Self-pay | Admitting: Cardiology

## 2017-07-07 ENCOUNTER — Ambulatory Visit (INDEPENDENT_AMBULATORY_CARE_PROVIDER_SITE_OTHER): Payer: Medicare Other | Admitting: Cardiology

## 2017-07-07 VITALS — BP 118/68 | HR 81 | Ht 68.0 in | Wt 188.6 lb

## 2017-07-07 DIAGNOSIS — Z79899 Other long term (current) drug therapy: Secondary | ICD-10-CM | POA: Diagnosis not present

## 2017-07-07 DIAGNOSIS — E785 Hyperlipidemia, unspecified: Secondary | ICD-10-CM

## 2017-07-07 DIAGNOSIS — I2510 Atherosclerotic heart disease of native coronary artery without angina pectoris: Secondary | ICD-10-CM | POA: Diagnosis not present

## 2017-07-07 DIAGNOSIS — R55 Syncope and collapse: Secondary | ICD-10-CM | POA: Diagnosis not present

## 2017-07-07 DIAGNOSIS — Z9861 Coronary angioplasty status: Secondary | ICD-10-CM

## 2017-07-07 DIAGNOSIS — Z8679 Personal history of other diseases of the circulatory system: Secondary | ICD-10-CM

## 2017-07-07 DIAGNOSIS — I10 Essential (primary) hypertension: Secondary | ICD-10-CM

## 2017-07-07 NOTE — Patient Instructions (Addendum)
Medication Instructions:  Your physician recommends that you continue on your current medications as directed. Please refer to the Current Medication list given to you today.  Labwork: Please return for FASTING labs (CMET, Lipid, TSH, HmgA1C)  Our in office lab hours are Monday-Friday 8:00-4:00, closed for lunch 12:45-1:45 pm.  No appointment needed.  Follow-Up: Your physician wants you to follow-up in: 12 months with Dr. Herbie BaltimoreHarding.  You will receive a reminder letter in the mail two months in advance. If you don't receive a letter, please call our office to schedule the follow-up appointment.    If you need a refill on your cardiac medications before your next appointment, please call your pharmacy.

## 2017-07-07 NOTE — Progress Notes (Signed)
PCP: [name], [name], MD'; [facility], [facility]  Clinic Note: Chief Complaint  Patient presents with  . Follow-up    No active symptoms  . Coronary Artery Disease  . Tachycardia    History of SVT with syncope    HPI: [name] is a 67 y.o. male with a PMH below who presents today for 1 yr f/u of CAD - s/p PCI LAD in 2005 - noteed to be patent on 2 relook Caths.  He also has documented PSVT (bu tno recurrent spells since ~2014 - had ILR - but no findings)  [name] was last seen in March 2018 - doing well.  No recurrence of chest pain/palpitations or dyspnea.  Nothing to suggest recurrence of SVT.  No further syncope.  Recent Hospitalizations: None  Studies Reviewed: None  Interval History: [name] presents here today doing quite well.  He indicates that he probably still does not get enough exercise, but he does get a lot of time walking around the farm.  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  He will probably no getting short of breath if he is to try to walk up a hill fast, but with routine exercise he is doing fine.  He can actually walk up the hill and does not have to stop now, but feels short of breath when he gets the top. He has not had any of the SVT episodes where he feels lightheaded or dizzy.  No syncope/near syncope or TIA/amaurosis fugax. No rapid irregular heartbeats palpitations.  No PND, orthopnea or edema.  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   No routine exercise.  Cardiovascular ROS: positive for - dyspnea on exertion negative for - chest pain, edema, irregular heartbeat, murmur, orthopnea, palpitations, paroxysmal nocturnal dyspnea, rapid heart rate, shortness of breath or Syncope/near syncope, TIA/amaurosis fugax.  No melena, hematochezia, hematuria, or epstaxis. No claudication.  ROS: A  comprehensive was performed. Review of Systems  Constitutional: Negative for malaise/fatigue and weight loss.  HENT: Negative for congestion, nosebleeds and sinus pain.   Respiratory: Negative for cough and wheezing.   Cardiovascular: Negative for claudication.       Per history of present illness  Gastrointestinal: Positive for constipation and heartburn (Still has episodes of bad heartburn.  He asked about taking Nexium, because Protonix does not work as well.). Negative for abdominal pain.  Musculoskeletal: Negative for falls, joint pain and myalgias.  Neurological: Positive for dizziness (Occasionally when he feels his heart going fast). Negative for focal weakness.  Psychiatric/Behavioral: Negative for memory loss. The patient is not nervous/anxious and does not have insomnia.     Past Medical History:  Diagnosis Date  . Arthritis   . Borderline diabetes   . CAD S/P percutaneous coronary angioplasty 2005   s/p PCI of LAD 3.0x1412mm Promus DES; ReCath in 07/2010 & 12/2012: Widely patent LAD stent with minimal disease elsewhere, Normal EF ; negative Myoview in January 2014  . Dyslipidemia   . Essential hypertension   . GERD (gastroesophageal reflux disease)   . History of PSVT (paroxysmal supraventricular tachycardia)   . History of syncope Jan 2014   s/p Loop Recorder - no positive etiology noted.  . Right clavicle fracture   . Seizures (HCC)    in teenage year's    Past Surgical History:  Procedure Laterality  Date  . CARDIAC CATHETERIZATION  2005   noncritical CAD, aneursym in prox LAD, normal LV systolic function  . CARDIAC CATHETERIZATION  2/28//2012   3.0x79mm Promus DES to LAD (repeat cath 07/2010 by Dr. Julieanne Manson showed patency of stent)  . CARDIAC CATHETERIZATION  12/2012   widely patent LAD stent; minimal CAD elsewhere; patulous LAD  . COLONOSCOPY W/ BIOPSIES AND POLYPECTOMY    . HERNIA REPAIR  2005  . LEFT HEART CATHETERIZATION WITH CORONARY ANGIOGRAM N/A 01/24/2013     Marykay Lex, MD;  Location: [facility] CATH LAB;  Service: Cardiovascular;: Widely patent LAD stent, minmal CAD elsewhere, normal EF  . LOOP RECORDER EXPLANT N/A 03/25/2013   Procedure: LOOP RECORDER EXPLANT;  Surgeon: Thurmon Fair, MD;  Location: [facility] CATH LAB;  Service: Cardiovascular;  Laterality: N/A;  . LOOP RECORDER IMPLANT  05/2012   Medtronic Reveal XT - placed for near-syncopal episodes by Dr. Judie Petit. Croitoru  . LOOP RECORDER IMPLANT N/A 05/11/2012   Procedure: LOOP RECORDER IMPLANT;  Surgeon: Thurmon Fair, MD;  Location: [facility] CATH LAB;  Service: Cardiovascular;  Laterality: N/A;  . NM MYOVIEW LTD  05/2012   Normal Stress Test - No ischemia or Infarction, EF 62%  . ORIF CLAVICULAR FRACTURE Right 01/10/2014   Procedure: OPEN REDUCTION INTERNAL FIXATION (ORIF) RIGHT  CLAVICULAR FRACTURE;  Surgeon: Verlee Rossetti, MD;  Location: [facility] OR;  Service: Orthopedics;  Laterality: Right;  . TRANSTHORACIC ECHOCARDIOGRAM  05/2012   EF 55-60%, normal wall motion, no valvular abnormalities - ordered for syncope    Current Meds  Medication Sig  . clopidogrel (PLAVIX) 75 MG tablet TAKE ONE TABLET BY MOUTH EVERY MORNING  . IRON PO Take by mouth every other day.  . metoprolol succinate (TOPROL-XL) 25 MG 24 hr tablet TAKE ONE TABLET BY MOUTH EVERY DAY  . nitroGLYCERIN (NITROSTAT) 0.4 MG SL tablet Place 1 tablet (0.4 mg total) under the tongue every 5 (five) minutes as needed. For chest pain  . pantoprazole (PROTONIX) 40 MG tablet Take 40 mg by mouth daily.  . pravastatin (PRAVACHOL) 80 MG tablet Take 1 tablet (80 mg total) by mouth at bedtime.  . ramipril (ALTACE) 5 MG capsule Take 1 capsule (5 mg total) by mouth daily.    No Known Allergies  Social History   Socioeconomic History  . Marital status: Married    Spouse name: None  . Number of children: 2  . Years of education: None  . Highest education level: None  Social Needs  . Financial resource strain: None  . Food insecurity - worry: None  .  Food insecurity - inability: None  . Transportation needs - medical: None  . Transportation needs - non-medical: None  Occupational History  . Occupation: chicken farm  Tobacco Use  . Smoking status: Never Smoker  . Smokeless tobacco: Never Used  Substance and Sexual Activity  . Alcohol use: No  . Drug use: No  . Sexual activity: Yes  Other Topics Concern  . None  Social History Narrative    He is a married father of 2, grandfather of 4. Nonsmoker. 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 No alcohol.     family history includes Cancer in his maternal grandfather; Heart attack in his brother and father; Heart disease in his mother; Hypertension in his mother; Leukemia in his maternal grandfather; Stroke in his paternal grandmother.  Wt Readings from Last  3 Encounters:  07/07/17 188 lb 9.6 oz (85.5 kg)  07/06/16 184 lb (83.5 kg)  07/01/15 183 lb (83 kg)    PHYSICAL EXAM BP 118/68 (BP Location: Left Arm, Patient Position: Sitting, Cuff Size: Normal)   Pulse 81   Ht 5\' 8"  (1.727 m)   Wt 188 lb 9.6 oz (85.5 kg)   BMI 28.68 kg/m   /Physical Exam  Constitutional: He is oriented to person, place, and time. He appears well-developed. No distress.  Healthy-appearing.  Well-groomed.  Well-nourished.  HENT:  Head: Normocephalic and atraumatic.  Eyes: EOM are normal.  Neck: Normal range of motion. Neck supple. No hepatojugular reflux and no JVD present. Carotid bruit is not present. No tracheal deviation present. No thyromegaly present.  Cardiovascular: Normal rate, regular rhythm, intact distal pulses and normal pulses.  No extrasystoles are present. PMI is not displaced. Exam reveals no gallop and no friction rub.  No murmur heard. Pulmonary/Chest: Effort normal and breath sounds normal. No respiratory distress. He has no wheezes. He has no rales.  Abdominal: Soft. Bowel sounds are normal. He exhibits no  distension. There is no tenderness.  Musculoskeletal: Normal range of motion. He exhibits no edema, tenderness or deformity.  Neurological: He is alert and oriented to person, place, and time. A cranial nerve deficit is present.  Skin: Skin is warm and dry. No erythema.  Psychiatric: He has a normal mood and affect. His behavior is normal. Judgment and thought content normal.  Nursing note and vitals reviewed.  General appearance: A&O x 3, cooperative, appears stated age, no distress; well-nourished well-groomed Neck: no adenopathy, no carotid bruit and no JVD HEENT: [place]/AT, EOMI, MMM, anicteric sclera Lungs: CTAB, normal percussion bilaterally and non-labored Heart:RRR , S1& S2 normal, no murmur, click, rub or gallop; nondisplaced PMI Abdomen: soft, non-tender; bowel sounds normal; no masses, no HSM or HJR Extremities: extremities normal, atraumatic, no cyanosis, or edema Pulses: 2+ and symmetric;  Skin: normal     Adult ECG Report  Rate: 81;  Rhythm: normal sinus rhythm and borderline LVH; otherwise normal axis, intervals & durations;   Narrative Interpretation: Essentially normal/stable EKG  Other studies Reviewed: Additional studies/ records that were reviewed today include:  Recent Labs:  Just checked by PCP. A1c 6.9 - has recheck scheduled in a few months.  Lipids doing better with 80 mg Pravastatin. (Labs not available)    ASSESSMENT / PLAN: Problem List Items Addressed This Visit    Near syncope - s/p Loop Recorder implant, now removed 03/26/13  (Chronic)    No further syncope.  Continue to monitor.  No rapid irregular heartbeats      Hyperlipidemia with target LDL less than 70 (Chronic)    He had been doing fairly well on current dose of pravastatin.  Is due for follow-up labs now.  We will check lipid panel and chemistries.      Relevant Orders   EKG 12-Lead   Comprehensive metabolic panel   Lipid panel   TSH   Hemoglobin A1c   History of PSVT (paroxysmal  supraventricular tachycardia) (Chronic)    Has usually been well controlled with Toprol.  Sounds he may have a few episodes of tachycardia but does not sound like it could be SVT.  We discussed vagal maneuvers and also potentially using additional dose of beta-blocker for breakthrough spells.      Relevant Orders   EKG 12-Lead   Comprehensive metabolic panel   Lipid panel   TSH   Hemoglobin A1c  Essential hypertension (Chronic)    Blood pressure looks good on current meds.  Continue current dose of Toprol and ramipril.      Relevant Orders   EKG 12-Lead   Comprehensive metabolic panel   Lipid panel   TSH   Hemoglobin A1c   CAD S/P percutaneous coronary angioplasty - Primary (Chronic)    Relatively short DES to the LAD.  Has been patent on to follow-up cath.  Also negative Myoview.  No recurrent anginal symptoms and not significant exertional dyspnea. Remains on Plavix alone without aspirin.  He is on beta-blocker, statin and ACE inhibitor. He is far enough out now I think he is probably fine switching from Protonix to Nexium.      Relevant Orders   EKG 12-Lead   Comprehensive metabolic panel   Lipid panel   TSH   Hemoglobin A1c    Other Visit Diagnoses    Medication management       Relevant Orders   Comprehensive metabolic panel   Lipid panel   TSH   Hemoglobin A1c      Current medicines are reviewed at length with the patient today. (+/- concerns) None The following changes have been made: None  Patient Instructions  Medication Instructions:  Your physician recommends that you continue on your current medications as directed. Please refer to the Current Medication list given to you today.  Labwork: Please return for FASTING labs (CMET, Lipid, TSH, HmgA1C)  Our in office lab hours are Monday-Friday 8:00-4:00, closed for lunch 12:45-1:45 pm.  No appointment needed.  Follow-Up: Your physician wants you to follow-up in: 12 months with Dr. Herbie Baltimore.  You will  receive a reminder letter in the mail two months in advance. If you don't receive a letter, please call our office to schedule the follow-up appointment.    If you need a refill on your cardiac medications before your next appointment, please call your pharmacy.     Studies Ordered:   Orders Placed This Encounter  Procedures  . Comprehensive metabolic panel  . Lipid panel  . TSH  . Hemoglobin A1c  . EKG 12-Lead      Bryan Lemma, M.D., M.S. Interventional Cardiologist   Pager # [phone] Phone # [phone] [place], [place] [address]

## 2017-07-09 ENCOUNTER — Encounter: Payer: Self-pay | Admitting: Cardiology

## 2017-07-09 NOTE — Assessment & Plan Note (Signed)
Has usually been well controlled with Toprol.  Sounds he may have a few episodes of tachycardia but does not sound like it could be SVT.  We discussed vagal maneuvers and also potentially using additional dose of beta-blocker for breakthrough spells.

## 2017-07-09 NOTE — Assessment & Plan Note (Signed)
Blood pressure looks good on current meds.  Continue current dose of Toprol and ramipril.

## 2017-07-09 NOTE — Assessment & Plan Note (Signed)
He had been doing fairly well on current dose of pravastatin.  Is due for follow-up labs now.  We will check lipid panel and chemistries.

## 2017-07-09 NOTE — Assessment & Plan Note (Signed)
Relatively short DES to the LAD.  Has been patent on to follow-up cath.  Also negative Myoview.  No recurrent anginal symptoms and not significant exertional dyspnea. Remains on Plavix alone without aspirin.  He is on beta-blocker, statin and ACE inhibitor. He is far enough out now I think he is probably fine switching from Protonix to Nexium.

## 2017-07-09 NOTE — Assessment & Plan Note (Signed)
No further syncope.  Continue to monitor.  No rapid irregular heartbeats

## 2017-07-12 LAB — COMPREHENSIVE METABOLIC PANEL
ALK PHOS: 74 IU/L (ref 39–117)
ALT: 33 IU/L (ref 0–44)
AST: 18 IU/L (ref 0–40)
Albumin/Globulin Ratio: 1.7 (ref 1.2–2.2)
Albumin: 4.2 g/dL (ref 3.6–4.8)
BUN / CREAT RATIO: 14 (ref 10–24)
BUN: 14 mg/dL (ref 8–27)
Bilirubin Total: 0.6 mg/dL (ref 0.0–1.2)
CALCIUM: 9.3 mg/dL (ref 8.6–10.2)
CO2: 23 mmol/L (ref 20–29)
CREATININE: 1 mg/dL (ref 0.76–1.27)
Chloride: 102 mmol/L (ref 96–106)
GFR calc Af Amer: 89 mL/min/{1.73_m2} (ref >59)
GFR, EST NON AFRICAN AMERICAN: 77 mL/min/{1.73_m2} (ref >59)
GLOBULIN, TOTAL: 2.5 g/dL (ref 1.5–4.5)
Glucose: 151 mg/dL — ABNORMAL HIGH (ref 65–99)
Potassium: 4.5 mmol/L (ref 3.5–5.2)
Sodium: 139 mmol/L (ref 134–144)
TOTAL PROTEIN: 6.7 g/dL (ref 6.0–8.5)

## 2017-07-12 LAB — LIPID PANEL
CHOL/HDL RATIO: 3 ratio (ref 0.0–5.0)
Cholesterol, Total: 130 mg/dL (ref 100–199)
HDL: 43 mg/dL (ref >39)
LDL CALC: 69 mg/dL (ref 0–99)
Triglycerides: 89 mg/dL (ref 0–149)
VLDL CHOLESTEROL CAL: 18 mg/dL (ref 5–40)

## 2017-07-12 LAB — TSH: TSH: 1.07 u[IU]/mL (ref 0.450–4.500)

## 2017-07-12 LAB — HEMOGLOBIN A1C
Est. average glucose Bld gHb Est-mCnc: 177 mg/dL
Hgb A1c MFr Bld: 7.8 % — ABNORMAL HIGH (ref 4.8–5.6)

## 2017-07-19 ENCOUNTER — Other Ambulatory Visit: Payer: Self-pay | Admitting: Cardiology

## 2017-07-27 ENCOUNTER — Telehealth: Payer: Self-pay | Admitting: *Deleted

## 2017-07-27 NOTE — Telephone Encounter (Signed)
-----   Message from Marykay Lexavid W Harding, MD sent at 07/19/2017  4:11 PM EDT ----- Chemistry panel looks great.  The only thing that is abnormal his glucose being 151.  Kidney function and liver function look great.  Cholesterol panel also looks great. Stable if not improved from last year.  No changes.  Bryan Lemmaavid Harding, MD   pls fwd to PCP: [name], [name], MD

## 2017-07-27 NOTE — Telephone Encounter (Signed)
LEFT DETAIL MESSAGE OF RESULTS ON SECURE VOICEMAIL . SUGGEST TO CONTACT PRIMARY IN REGARDS BLOOD GLUCOSE. ANY QUESTION MAY CALL BACK.  ROUTED TO PRIMARY DR Mayford KnifeWILLIAMS

## 2018-01-14 ENCOUNTER — Other Ambulatory Visit: Payer: Self-pay | Admitting: Cardiology

## 2018-01-15 ENCOUNTER — Other Ambulatory Visit: Payer: Self-pay | Admitting: Cardiology

## 2018-07-09 ENCOUNTER — Ambulatory Visit (INDEPENDENT_AMBULATORY_CARE_PROVIDER_SITE_OTHER): Payer: Medicare Other | Admitting: Cardiology

## 2018-07-09 ENCOUNTER — Encounter: Payer: Self-pay | Admitting: Cardiology

## 2018-07-09 VITALS — BP 122/72 | HR 80 | Ht 68.0 in | Wt 181.8 lb

## 2018-07-09 DIAGNOSIS — I2510 Atherosclerotic heart disease of native coronary artery without angina pectoris: Secondary | ICD-10-CM

## 2018-07-09 DIAGNOSIS — E785 Hyperlipidemia, unspecified: Secondary | ICD-10-CM

## 2018-07-09 DIAGNOSIS — R55 Syncope and collapse: Secondary | ICD-10-CM

## 2018-07-09 DIAGNOSIS — Z8679 Personal history of other diseases of the circulatory system: Secondary | ICD-10-CM | POA: Diagnosis not present

## 2018-07-09 DIAGNOSIS — Z9861 Coronary angioplasty status: Secondary | ICD-10-CM | POA: Diagnosis not present

## 2018-07-09 DIAGNOSIS — I10 Essential (primary) hypertension: Secondary | ICD-10-CM

## 2018-07-09 NOTE — Patient Instructions (Addendum)
Medication Instructions:  CHANGE THE TIME IN TAKING  TOPROL TO EVENING TIME AND RAMIPRIL  IN THE MORNING If you need a refill on your cardiac medications before your next appointment, please call your pharmacy.   Lab work: NOT NEEDED If you have labs (blood work) drawn today and your tests are completely normal, you will receive your results only by: Marland Kitchen MyChart Message (if you have MyChart) OR . A paper copy in the mail If you have any lab test that is abnormal or we need to change your treatment, we will call you to review the results.  Testing/Procedures: NOT NEEDED  Follow-Up: At [facility], you and your health needs are our priority.  As part of our continuing mission to provide you with exceptional heart care, we have created designated Provider Care Teams.  These Care Teams include your primary Cardiologist (physician) and Advanced Practice Providers (APPs -  Physician Assistants and Nurse Practitioners) who all work together to provide you with the care you need, when you need it. You will need a follow up appointment in 12 months [facility] 2021.  Please call our office 2 months in advance to schedule this appointment.  You may see Bryan Lemma, MD or one of the following Advanced Practice Providers on your designated Care Team:   Theodore Demark, PA-C . Joni Reining, DNP, ANP  Any Other Special Instructions Will Be Listed Below (If Applicable). CONTACT OFFICE  IF PALPATION BECOME MORE  FREQUENT

## 2018-07-09 NOTE — Progress Notes (Signed)
PCP: [facility], [facility]  Clinic Note: Chief Complaint  Patient presents with  . Follow-up    Annual  . Coronary Artery Disease    No further angina  . Tachycardia    History of PSVT    HPI: [name] is a 68 y.o. male with a PMH of CAD-PCI LAD 2005 as well as PSVT who presents today for 1 yr f/u.  LAD stent noted to be patent on 2 relook Caths.   No recurrent spell of PSVT documented since 2014 (has ILR implanted).   [name] was last seen in March 2019 - doing well.  No CP or SVT.  Just not getting enough exercise.   Recent Hospitalizations: None  Studies Reviewed: None  Interval History: [name] presents here today overall doing well from a cardiac standpoint.  He said he he had an episode about a month ago when he had just had a bottle of diet [facility] and he felt a tightness across his lower chest.  Pretty much the costochondral margin.  Not localized in the front of her chest.  He thought it may be related to congestion or GERD. He says it really over the last year or so he is noted his stamina is just not as good as it was before.  This is a common theme for him.  He indicates he feels tired at the end of the day but this may be a day that entailed cutting wood and moving the wood etc.. Deftly gets short of breath if he would go up Flights steps or rush upper one flight of steps or up a hill.  No heart failure symptoms of PND, orthopnea or edema. His palpitations seem to be pretty controlled, but more noted in the morning.  Cardiovascular ROS: positive for - dyspnea on exertion negative for - chest pain, edema, irregular heartbeat, murmur, orthopnea, palpitations, paroxysmal nocturnal dyspnea, rapid heart rate, shortness of breath or Syncope/near syncope, TIA/amaurosis fugax.  No melena, hematochezia, hematuria, or epstaxis. No claudication.  ROS: A comprehensive was performed. Review of Systems    Constitutional: Negative for malaise/fatigue and weight loss.  HENT: Negative for congestion, nosebleeds and sinus pain.   Respiratory: Negative for cough and wheezing.   Cardiovascular: Negative for claudication.       Per history of present illness  Gastrointestinal: Positive for constipation and heartburn (Still has episodes of bad heartburn.  He asked about taking Nexium, because Protonix does not work as well.). Negative for abdominal pain.  Musculoskeletal: Negative for falls, joint pain and myalgias.  Neurological: Positive for dizziness (Occasionally when he feels his heart going fast). Negative for focal weakness.  Psychiatric/Behavioral: Negative for memory loss. The patient is not nervous/anxious and does not have insomnia.     Past Medical History:  Diagnosis Date  . Arthritis   . Borderline diabetes   . CAD S/P percutaneous coronary angioplasty 2005   s/p PCI of LAD 3.0x4mm Promus DES; ReCath in 07/2010 & 12/2012: Widely patent LAD stent with minimal disease elsewhere, Normal EF ; negative Myoview in January 2014  . Dyslipidemia   . Essential hypertension   . GERD (gastroesophageal reflux disease)   . History of PSVT (paroxysmal supraventricular tachycardia)   . History of syncope Jan 2014   s/p Loop Recorder - no positive etiology noted.  . Right clavicle fracture   . Seizures (HCC)    in teenage year's    Past Surgical History:  Procedure Laterality Date  . CARDIAC CATHETERIZATION  2005   noncritical CAD, aneursym in prox LAD, normal LV systolic function  . CARDIAC CATHETERIZATION  2/28//2012   3.0x20mm Promus DES to LAD (repeat cath 07/2010 by Dr. Julieanne Manson showed patency of stent)  . CARDIAC CATHETERIZATION  12/2012   widely patent LAD stent; minimal CAD elsewhere; patulous LAD  . COLONOSCOPY W/ BIOPSIES AND POLYPECTOMY    . HERNIA REPAIR  2005  . LEFT HEART CATHETERIZATION WITH CORONARY ANGIOGRAM N/A 01/24/2013   Marykay Lex, MD;  Location: [facility] CATH LAB;   Service: Cardiovascular;: Widely patent LAD stent, minmal CAD elsewhere, normal EF  . LOOP RECORDER EXPLANT N/A 03/25/2013   Procedure: LOOP RECORDER EXPLANT;  Surgeon: Thurmon Fair, MD;  Location: [facility] CATH LAB;  Service: Cardiovascular;  Laterality: N/A;  . LOOP RECORDER IMPLANT  05/2012   Medtronic Reveal XT - placed for near-syncopal episodes by Dr. Judie Petit. Croitoru  . LOOP RECORDER IMPLANT N/A 05/11/2012   Procedure: LOOP RECORDER IMPLANT;  Surgeon: Thurmon Fair, MD;  Location: [facility] CATH LAB;  Service: Cardiovascular;  Laterality: N/A;  . NM MYOVIEW LTD  05/2012   Normal Stress Test - No ischemia or Infarction, EF 62%  . ORIF CLAVICULAR FRACTURE Right 01/10/2014   Procedure: OPEN REDUCTION INTERNAL FIXATION (ORIF) RIGHT  CLAVICULAR FRACTURE;  Surgeon: Verlee Rossetti, MD;  Location: [facility] OR;  Service: Orthopedics;  Laterality: Right;  . TRANSTHORACIC ECHOCARDIOGRAM  05/2012   EF 55-60%, normal wall motion, no valvular abnormalities - ordered for syncope    Current Meds  Medication Sig  . clopidogrel (PLAVIX) 75 MG tablet TAKE ONE TABLET BY MOUTH EVERY MORNING  . IRON PO Take by mouth every other day.  . metFORMIN (GLUCOPHAGE) 500 MG tablet Take 1 tablet by mouth 2 (two) times daily.  . metoprolol succinate (TOPROL-XL) 25 MG 24 hr tablet TAKE ONE TABLET BY MOUTH EVERY DAY  . nitroGLYCERIN (NITROSTAT) 0.4 MG SL tablet Place 1 tablet (0.4 mg total) under the tongue every 5 (five) minutes as needed. For chest pain  . pantoprazole (PROTONIX) 40 MG tablet Take 40 mg by mouth daily.  . pravastatin (PRAVACHOL) 80 MG tablet TAKE ONE TABLET BY MOUTH AT BEDTIME  . ramipril (ALTACE) 5 MG capsule TAKE ONE CAPSULE BY MOUTH EVERY DAY    No Known Allergies  Social History   Tobacco Use  . Smoking status: Never Smoker  . Smokeless tobacco: Never Used  Substance Use Topics  . Alcohol use: No  . Drug use: No   Social History   Social History Narrative    He is a married father of 2, grandfather of 4.  Nonsmoker. 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 No alcohol.    Family History family history includes Cancer in his maternal grandfather; Heart attack in his brother and father; Heart disease in his mother; Hypertension in his mother; Leukemia in his maternal grandfather; Stroke in his paternal grandmother.  Wt Readings from Last 3 Encounters:  07/09/18 181 lb 12.8 oz (82.5 kg)  07/07/17 188 lb 9.6 oz (85.5 kg)  07/06/16 184 lb (83.5 kg)    PHYSICAL EXAM BP 122/72   Pulse 80   Ht 5\' 8"  (1.727 m)   Wt 181 lb 12.8 oz (82.5 kg)   BMI 27.64 kg/m   /Physical Exam  Constitutional: He is oriented to person, place, and time. He appears  well-developed. No distress.  Healthy-appearing.  Well-groomed.  Well-nourished.  HENT:  Head: Normocephalic and atraumatic.  Eyes: EOM are normal.  Neck: Normal range of motion. Neck supple. No hepatojugular reflux and no JVD present. Carotid bruit is not present. No tracheal deviation present. No thyromegaly present.  Cardiovascular: Normal rate, regular rhythm, intact distal pulses and normal pulses.  No extrasystoles are present. PMI is not displaced. Exam reveals no gallop and no friction rub.  No murmur heard. Pulmonary/Chest: Effort normal and breath sounds normal. No respiratory distress. He has no wheezes. He has no rales.  Abdominal: Soft. Bowel sounds are normal. He exhibits no distension. There is no abdominal tenderness.  Musculoskeletal: Normal range of motion.        General: No tenderness, deformity or edema.  Neurological: He is alert and oriented to person, place, and time. A cranial nerve deficit is present.  Skin: Skin is warm and dry. No erythema.  Psychiatric: He has a normal mood and affect. His behavior is normal. Judgment and thought content normal.  Nursing note and vitals reviewed.   Adult ECG Report  Rate: 81;  Rhythm: normal sinus rhythm and  borderline LVH; otherwise normal axis, intervals & durations;   Narrative Interpretation: Essentially normal/stable EKG  Other studies Reviewed: Additional studies/ records that were reviewed today include:  Recent Labs:  Just checked by PCP ~4 months ago.   ASSESSMENT / PLAN: Problem List Items Addressed This Visit    CAD S/P percutaneous coronary angioplasty - Primary (Chronic)    Doing well post PCI.  Many years out.  No recurrent angina symptoms.  Relatively short large caliber stent.  He is on maintenance Plavix along with beta-blocker, ACE inhibitor and statin.  He has had at least 2 follow-up cath showing patent stent.  Is also had stress test evaluations that have been negative.      Relevant Orders   EKG 12-Lead (Completed)   Essential hypertension (Chronic)    Blood pressure well controlled on low-dose Toprol and Avapro.  Because of his issues of fast heart rate spells in the morning, I am having him switch his metoprolol to p.m. and ACE inhibitor tomorrow evening.      History of PSVT (paroxysmal supraventricular tachycardia) (Chronic)    Fairly well controlled with Toprol.  He had one episode triggered by diet [facility] several months ago, but nothing lasted more than a few minutes.  We discussed vagal maneuvers and avoiding triggers.      Relevant Orders   EKG 12-Lead (Completed)   Hyperlipidemia with target LDL less than 70 (Chronic)    Last LDL check in March 2019 showed an LDL of 69.  We would love her to get less than 50.  Remains on pravastatin and is due to have labs checked in the next month or so.  My recommendation would be to potentially convert to rosuvastatin 20mg  if he is not at goal in follow-up.      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   He says these may happen 1 out  of every 2 weeks.  Episodes last less than a minute, nothing like his SVT episodes in the past.  No syncope or near syncope.  Just some dizziness.         Current medicines are reviewed  at length with the patient today. Some dizziness and lightheadedness with palpitations in the morning (+/- concerns) None The following changes have been made:See adjustments below  Patient Instructions  Medication Instructions:  CHANGE THE TIME IN TAKING  TOPROL TO EVENING TIME AND RAMIPRIL  IN THE MORNING If you need a refill on your cardiac medications before your next appointment, please call your pharmacy.   Lab work: NOT NEEDED If you have labs (blood work) drawn today and your tests are completely normal, you will receive your results only by: Marland Kitchen MyChart Message (if you have MyChart) OR . A paper copy in the mail If you have any lab test that is abnormal or we need to change your treatment, we will call you to review the results.  Testing/Procedures: NOT NEEDED  Follow-Up: At [facility], you and your health needs are our priority.  As part of our continuing mission to provide you with exceptional heart care, we have created designated Provider Care Teams.  These Care Teams include your primary Cardiologist (physician) and Advanced Practice Providers (APPs -  Physician Assistants and Nurse Practitioners) who all work together to provide you with the care you need, when you need it. You will need a follow up appointment in 12 months [facility] 2021.  Please call our office 2 months in advance to schedule this appointment.  You may see Bryan Lemma, MD or one of the following Advanced Practice Providers on your designated Care Team:   Theodore Demark, PA-C . Joni Reining, DNP, ANP  Any Other Special Instructions Will Be Listed Below (If Applicable). CONTACT OFFICE  IF PALPATION BECOME MORE  FREQUENT   Studies Ordered:   Orders Placed This Encounter  Procedures  . EKG 12-Lead       Bryan Lemma, M.D., M.S. Interventional Cardiologist   Pager # [phone] Phone # [phone] [address] [place], [place] [address]

## 2018-07-10 ENCOUNTER — Encounter: Payer: Self-pay | Admitting: Cardiology

## 2018-07-10 NOTE — Assessment & Plan Note (Signed)
No further syncope, but he does have occasional lightheaded fleshy type sensations where his heart rate seems to be going fast in the mornings.   He says these may happen 1 out of every 2 weeks.  Episodes last less than a minute, nothing like his SVT episodes in the past.  No syncope or near syncope.  Just some dizziness.

## 2018-07-10 NOTE — Assessment & Plan Note (Signed)
Blood pressure well controlled on low-dose Toprol and Avapro.  Because of his issues of fast heart rate spells in the morning, I am having him switch his metoprolol to p.m. and ACE inhibitor tomorrow evening.

## 2018-07-10 NOTE — Assessment & Plan Note (Signed)
Last LDL check in March 2019 showed an LDL of 69.  We would love her to get less than 50.  Remains on pravastatin and is due to have labs checked in the next month or so.  My recommendation would be to potentially convert to rosuvastatin 20mg  if he is not at goal in follow-up.

## 2018-07-10 NOTE — Assessment & Plan Note (Signed)
Doing well post PCI.  Many years out.  No recurrent angina symptoms.  Relatively short large caliber stent.  He is on maintenance Plavix along with beta-blocker, ACE inhibitor and statin.  He has had at least 2 follow-up cath showing patent stent.  Is also had stress test evaluations that have been negative.

## 2018-07-10 NOTE — Assessment & Plan Note (Signed)
Fairly well controlled with Toprol.  He had one episode triggered by diet [facility] several months ago, but nothing lasted more than a few minutes.  We discussed vagal maneuvers and avoiding triggers.

## 2018-07-15 ENCOUNTER — Other Ambulatory Visit: Payer: Self-pay | Admitting: Cardiology

## 2018-07-17 ENCOUNTER — Other Ambulatory Visit: Payer: Self-pay | Admitting: *Deleted

## 2018-07-17 MED ORDER — CLOPIDOGREL BISULFATE 75 MG PO TABS: 75.0000 mg | ORAL_TABLET | Freq: Every morning | ORAL | 11 refills | Status: DC

## 2018-08-22 ENCOUNTER — Telehealth: Payer: Self-pay | Admitting: *Deleted

## 2018-08-22 DIAGNOSIS — E785 Hyperlipidemia, unspecified: Secondary | ICD-10-CM

## 2018-08-22 MED ORDER — ROSUVASTATIN CALCIUM 40 MG PO TABS: 40.0000 mg | ORAL_TABLET | Freq: Every day | ORAL | 3 refills | Status: DC

## 2018-08-22 NOTE — Telephone Encounter (Signed)
-----   Message from Marykay Lex, MD sent at 08/22/2018 12:52 PM EDT ----- Recent Labs: 08/16/2018 Na+ 140, K+ 4.7, Cl- 101, HCO3- 25 , BUN 16, Cr 1.05, Glu 126, Ca2+ 9.4; AST 18, ALT, 24 AlkP 66 TC 132, TG 86, HDL 43, LDL 72   Overall labs look pretty good.  Would like to see the LDL less than 50.  I do not think this can be achieved with pravastatin 80 mg.  Would like to give a trial run of rosuvastatin 40 mg daily for a month and reassess.  If no change, would go back to pravastatin 80 mg.  Bryan Lemma, MD   Complete current bottle of Pravastain -- then change to Rosuvastatin 40 mg PO daily, Disp 90, 1 refill.   -- recheck FLP 3 months after changing.

## 2018-08-22 NOTE — Telephone Encounter (Signed)
PATIENT AWARE OF LABS RESULTS AND MEDICATION CHANGES , AWARE LABS WILL BE DONE IN @  AFTER STARTING ROSUVASTATIN 40 MG .

## 2018-09-19 ENCOUNTER — Telehealth: Payer: Self-pay | Admitting: Cardiology

## 2018-09-19 NOTE — Telephone Encounter (Signed)
Follow up    Patient is returning call in refernce to medication. He states that the one that is covered as a tier 1 with no copay is the atorvastin 18mg . Please call to discuss.

## 2018-09-19 NOTE — Telephone Encounter (Signed)
Spoke to patient  He states that  Rosuvastatin will be $139  For 3 months.  when pravastatin 80 mg was cheaper. RN informed patient that the rosuvastatin maybe on another tier of medications-  Informed patient to contact insurance and find out what is on his lower tier, patient  Verbalized understanding

## 2018-09-19 NOTE — Telephone Encounter (Signed)
New message   Pt c/o medication issue:  1. Name of Medication: rosuvastatin (CRESTOR) 40 MG tablet  2. How are you currently taking this medication (dosage and times per day)? n/a  3. Are you having a reaction (difficulty breathing--STAT)? n/a  4. What is your medication issue? Patient states that this medicine is around $180.00 for a 3 month supply. Please contact the patient. He is at the pharmacy waiting for a call from the nurse.

## 2018-09-19 NOTE — Telephone Encounter (Signed)
Patient aware will defer to dr harding to see if medication can be switch to  atovastatin form rosuvastatin due to the cost.

## 2018-09-19 NOTE — Telephone Encounter (Signed)
That is fine - can try 80 mg first.  [facility]

## 2018-09-20 MED ORDER — ATORVASTATIN CALCIUM 80 MG PO TABS: 80.0000 mg | ORAL_TABLET | Freq: Every day | ORAL | 3 refills | Status: DC

## 2018-09-20 NOTE — Telephone Encounter (Signed)
Called spoke to patient . Instruction given for medication change to atorvastatin 80 mg .  change to medication list .-e-sent to pharmacy . Patient aware and verbalized understanding.

## 2018-09-20 NOTE — Addendum Note (Signed)
Addended by: Tobin Chad on: 09/20/2018 08:37 AM   Modules accepted: Orders

## 2018-12-07 ENCOUNTER — Telehealth: Payer: Self-pay | Admitting: *Deleted

## 2018-12-07 DIAGNOSIS — E785 Hyperlipidemia, unspecified: Secondary | ICD-10-CM

## 2018-12-07 NOTE — Telephone Encounter (Signed)
-----   Message from Raiford Simmonds, RN sent at 08/22/2018  3:21 PM EDT ----- NEED LABS IN AUG 21  AFTER BEING ON ROSUVASTATIN FOR  AT LEAST 3 MONTHS   FASTIN LIPID ONLY   WILL MAIL @ Dec 03, 2018 LABSLIP AND LETTER

## 2018-12-07 NOTE — Telephone Encounter (Signed)
Mailed letter and labslip 

## 2018-12-19 LAB — LIPID PANEL
Chol/HDL Ratio: 2.6 ratio (ref 0.0–5.0)
Cholesterol, Total: 98 mg/dL — ABNORMAL LOW (ref 100–199)
HDL: 37 mg/dL — ABNORMAL LOW (ref >39)
LDL Calculated: 44 mg/dL (ref 0–99)
Triglycerides: 83 mg/dL (ref 0–149)
VLDL Cholesterol Cal: 17 mg/dL (ref 5–40)

## 2019-02-21 ENCOUNTER — Other Ambulatory Visit: Payer: Self-pay | Admitting: Cardiology

## 2019-02-21 MED ORDER — RAMIPRIL 5 MG PO CAPS: 5.0000 mg | ORAL_CAPSULE | Freq: Every day | ORAL | 1 refills | Status: DC

## 2019-02-21 NOTE — Telephone Encounter (Signed)
New Message      *STAT* If patient is at the pharmacy, call can be transferred to refill team.   1. Which medications need to be refilled? (please list name of each medication and dose if known) Ramipril 5 mg   2. Which pharmacy/location (including street and city if local pharmacy) is medication to be sent to? [facility]   3. Do they need a 30 day or 90 day supply? 90 days

## 2019-02-21 NOTE — Telephone Encounter (Signed)
Call pt to clarify pharmacy, pt stated the pharmacy we have on file is the correct pharmacy[facility] [address] [facility]

## 2019-02-21 NOTE — Telephone Encounter (Signed)
Rx has been sent to the pharmacy electronically. ° °

## 2019-02-21 NOTE — Telephone Encounter (Signed)
Pt calling requesting that his medication Ramipril be sent to a different pharmacy, [facility]. Please address

## 2019-07-07 ENCOUNTER — Other Ambulatory Visit: Payer: Self-pay | Admitting: Cardiology

## 2019-07-08 ENCOUNTER — Other Ambulatory Visit: Payer: Self-pay | Admitting: Cardiology

## 2019-07-09 ENCOUNTER — Telehealth: Payer: Self-pay

## 2019-07-09 ENCOUNTER — Encounter: Payer: Self-pay | Admitting: Cardiology

## 2019-07-09 ENCOUNTER — Telehealth (INDEPENDENT_AMBULATORY_CARE_PROVIDER_SITE_OTHER): Payer: Medicare Other | Admitting: Cardiology

## 2019-07-09 VITALS — Ht 68.0 in | Wt 175.0 lb

## 2019-07-09 DIAGNOSIS — I10 Essential (primary) hypertension: Secondary | ICD-10-CM

## 2019-07-09 DIAGNOSIS — I2510 Atherosclerotic heart disease of native coronary artery without angina pectoris: Secondary | ICD-10-CM | POA: Diagnosis not present

## 2019-07-09 DIAGNOSIS — Z9861 Coronary angioplasty status: Secondary | ICD-10-CM

## 2019-07-09 DIAGNOSIS — E785 Hyperlipidemia, unspecified: Secondary | ICD-10-CM

## 2019-07-09 DIAGNOSIS — K219 Gastro-esophageal reflux disease without esophagitis: Secondary | ICD-10-CM

## 2019-07-09 DIAGNOSIS — E119 Type 2 diabetes mellitus without complications: Secondary | ICD-10-CM | POA: Insufficient documentation

## 2019-07-09 NOTE — Patient Instructions (Signed)
Medication Instructions:   Your physician recommends that you continue on your current medications as directed. Please refer to the Current Medication list given to you today.  *If you need a refill on your cardiac medications before your next appointment, please call your pharmacy*  Lab Work: NONE ordered at this time of appointment   If you have labs (blood work) drawn today and your tests are completely normal, you will receive your results only by: . MyChart Message (if you have MyChart) OR . A paper copy in the mail If you have any lab test that is abnormal or we need to change your treatment, we will call you to review the results.  Testing/Procedures: NONE ordered at this time of appointment   Follow-Up: At [facility], you and your health needs are our priority.  As part of our continuing mission to provide you with exceptional heart care, we have created designated Provider Care Teams.  These Care Teams include your primary Cardiologist (physician) and Advanced Practice Providers (APPs -  Physician Assistants and Nurse Practitioners) who all work together to provide you with the care you need, when you need it.  We recommend signing up for the patient portal called "MyChart".  Sign up information is provided on this After Visit Summary.  MyChart is used to connect with patients for Virtual Visits (Telemedicine).  Patients are able to view lab/test results, encounter notes, upcoming appointments, etc.  Non-urgent messages can be sent to your provider as well.   To learn more about what you can do with MyChart, go to [email or web address].    Your next appointment:   1 year(s)  The format for your next appointment:   In Person  Provider:   David Harding, MD  Other Instructions   

## 2019-07-09 NOTE — Progress Notes (Signed)
Virtual Visit via Telephone Note   This visit type was conducted due to national recommendations for restrictions regarding the COVID-19 Pandemic (e.g. social distancing) in an effort to limit this patient's exposure and mitigate transmission in our community.  Due to his co-morbid illnesses, this patient is at least at moderate risk for complications without adequate follow up.  This format is felt to be most appropriate for this patient at this time.  The patient did not have access to video technology/had technical difficulties with video requiring transitioning to audio format only (telephone).  All issues noted in this document were discussed and addressed.  No physical exam could be performed with this format.  Please refer to the patient's chart for his  consent to telehealth for [facility].   The patient was identified using 2 identifiers.  Date:  07/09/2019   ID:  [name], DOB October 28, 1950, MRN [id number]  Patient Location: Home Provider Location: Home  [facility]P:  [facility]  Cardiologist:  Bryan Lemma, MD  Electrophysiologist:  None   Evaluation Performed:  Follow-Up Visit  Chief Complaint:  none  History of Present Illness:    [name] is a 69 y.o. male followed by Dr. Herbie Baltimore with a history of coronary disease.  He was contacted today for routine follow up. 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  He is very active [place].  From a cardiac standpoint he is done well, he has occasional chest discomfort which he attributes to reflux.  He had an LAD PCI with DES in 2005, catheterizations in 2012 and 2014 showed the site to be patent.  He has had a history of syncope in 2014, a loop recorder was implanted and subsequently explanted without showing any significant arrhythmia.  He has had PSVT in the past and is on a beta-blocker for this.  He has non-insulin-dependent diabetes and is on Glucophage.  He has dyslipidemia, his most recent LDL  was 44 in August 2020.  The patient does not have symptoms concerning for COVID-19 infection (fever, chills, cough, or new shortness of breath).    Past Medical History:  Diagnosis Date   Arthritis    Borderline diabetes    CAD S/P percutaneous coronary angioplasty 2005   s/p PCI of LAD 3.0x34mm Promus DES; ReCath in 07/2010 & 12/2012: Widely patent LAD stent with minimal disease elsewhere, Normal EF ; negative Myoview in January 2014   Dyslipidemia    Essential hypertension    GERD (gastroesophageal reflux disease)    History of PSVT (paroxysmal supraventricular tachycardia)    History of syncope Jan 2014   s/p Loop Recorder - no positive etiology noted.   Right clavicle fracture    Seizures (HCC)    in teenage year's   Past Surgical History:  Procedure Laterality Date   CARDIAC CATHETERIZATION  2005   noncritical CAD, aneursym in prox LAD, normal LV systolic function   CARDIAC CATHETERIZATION  2/28//2012   3.0x61mm Promus DES to LAD (repeat cath 07/2010 by Dr. Julieanne Manson showed patency of stent)   CARDIAC CATHETERIZATION  12/2012   widely patent LAD stent; minimal CAD elsewhere; patulous LAD   COLONOSCOPY W/ BIOPSIES AND POLYPECTOMY     HERNIA REPAIR  2005   LEFT HEART CATHETERIZATION WITH CORONARY ANGIOGRAM N/A 01/24/2013   Marykay Lex, MD;  Location: [facility] CATH LAB;  Service: Cardiovascular;: Widely patent LAD stent, minmal CAD elsewhere, normal EF   LOOP RECORDER EXPLANT N/A  03/25/2013   Procedure: LOOP RECORDER EXPLANT;  Surgeon: Thurmon Fair, MD;  Location: [facility] CATH LAB;  Service: Cardiovascular;  Laterality: N/A;   LOOP RECORDER IMPLANT  05/2012   Medtronic Reveal XT - placed for near-syncopal episodes by Dr. Judie Petit. Croitoru   LOOP RECORDER IMPLANT N/A 05/11/2012   Procedure: LOOP RECORDER IMPLANT;  Surgeon: Thurmon Fair, MD;  Location: [facility] CATH LAB;  Service: Cardiovascular;  Laterality: N/A;   NM MYOVIEW LTD  05/2012   Normal Stress Test - No ischemia  or Infarction, EF 62%   ORIF CLAVICULAR FRACTURE Right 01/10/2014   Procedure: OPEN REDUCTION INTERNAL FIXATION (ORIF) RIGHT  CLAVICULAR FRACTURE;  Surgeon: Verlee Rossetti, MD;  Location: [facility] OR;  Service: Orthopedics;  Laterality: Right;   TRANSTHORACIC ECHOCARDIOGRAM  05/2012   EF 55-60%, normal wall motion, no valvular abnormalities - ordered for syncope     Current Meds  Medication Sig   atorvastatin (LIPITOR) 80 MG tablet Take 1 tablet (80 mg total) by mouth daily.   clopidogrel (PLAVIX) 75 MG tablet TAKE 1 TABLET (75 MG TOTAL) BY MOUTH EVERY MORNING.   Cyanocobalamin (QC VITAMIN B12 PO) Take by mouth.   IRON PO Take by mouth every other day.   metFORMIN (GLUCOPHAGE) 500 MG tablet Take 1 tablet by mouth 2 (two) times daily.   metoprolol succinate (TOPROL-XL) 25 MG 24 hr tablet TAKE ONE TABLET BY MOUTH EVERY DAY   nitroGLYCERIN (NITROSTAT) 0.4 MG SL tablet Place 1 tablet (0.4 mg total) under the tongue every 5 (five) minutes as needed. For chest pain   pantoprazole (PROTONIX) 40 MG tablet Take 40 mg by mouth daily.   ramipril (ALTACE) 5 MG capsule Take 1 capsule (5 mg total) by mouth daily.   VITAMIN D, CHOLECALCIFEROL, PO Take by mouth.     Allergies:   Patient has no known allergies.   Social History   Tobacco Use   Smoking status: Never Smoker   Smokeless tobacco: Never Used  Substance Use Topics   Alcohol use: No   Drug use: No     Family Hx: The patient's family history includes Cancer in his maternal grandfather; Heart attack in his brother and father; Heart disease in his mother; Hypertension in his mother; Leukemia in his maternal grandfather; Stroke in his paternal grandmother.  ROS:   Please see the history of present illness.    All other systems reviewed and are negative.   Prior CV studies:   The following studies were reviewed today:   Labs/Other Tests and Data Reviewed:    EKG:  An ECG dated 07/10/2018 was personally reviewed today and  demonstrated:  NSR, LAD, HR 81  Recent Labs: No results found for requested labs within last 8760 hours.   Recent Lipid Panel Lab Results  Component Value Date/Time   CHOL 98 (L) 12/19/2018 08:25 AM   TRIG 83 12/19/2018 08:25 AM   HDL 37 (L) 12/19/2018 08:25 AM   CHOLHDL 2.6 12/19/2018 08:25 AM   CHOLHDL 2.6 01/24/2013 07:05 AM   LDLCALC 44 12/19/2018 08:25 AM    Wt Readings from Last 3 Encounters:  07/09/19 175 lb (79.4 kg)  07/09/18 181 lb 12.8 oz (82.5 kg)  07/07/17 188 lb 9.6 oz (85.5 kg)     Objective:    Vital Signs:  Ht 5\' 8"  (1.727 m)    Wt 175 lb (79.4 kg)    BMI 26.61 kg/m    VITAL SIGNS:  reviewed  ASSESSMENT & PLAN:  CAD- S/p LAD PCI with DES 2005- patent at cath 2012 and 2014  HTN- He tells me his B/P runs 798-921 systolic  HLD- LDL 44 in Aug 2020  NIDDM- He is on Glucophage  PSVT- No recent episodes  COVID-19 Education: The signs and symptoms of COVID-19 were discussed with the patient and how to seek care for testing (follow up with PCP or arrange E-visit).  The importance of social distancing was discussed today.  Time:   Today, I have spent 15 minutes with the patient with telehealth technology discussing the above problems.     Medication Adjustments/Labs and Tests Ordered: Current medicines are reviewed at length with the patient today.  Concerns regarding medicines are outlined above.   Tests Ordered: No orders of the defined types were placed in this encounter.   Medication Changes: No orders of the defined types were placed in this encounter.   Follow Up:  In Person in one year with Dr Ellyn Hack  Signed, Kerin Ransom, PA-C  07/09/2019 8:09 AM    [place]

## 2019-07-09 NOTE — Telephone Encounter (Signed)
Virtual Visit Pre-Appointment Phone Call  "(Name), I am calling you today to discuss your upcoming appointment. We are currently trying to limit exposure to the virus that causes COVID-19 by seeing patients at home rather than in the office."  1. "What is the BEST phone number to call the day of the visit?" - include this in appointment notes  2. "Do you have or have access to (through a family member/friend) a smartphone with video capability that we can use for your visit?" a. If yes - list this number in appt notes as "cell" (if different from BEST phone #) and list the appointment type as a VIDEO visit in appointment notes b. If no - list the appointment type as a PHONE visit in appointment notes  3. Confirm consent - "In the setting of the current Covid19 crisis, you are scheduled for a (phone or video) visit with your provider on (date) at (time).  Just as we do with many in-office visits, in order for you to participate in this visit, we must obtain consent.  If you'd like, I can send this to your mychart (if signed up) or email for you to review.  Otherwise, I can obtain your verbal consent now.  All virtual visits are billed to your insurance company just like a normal visit would be.  By agreeing to a virtual visit, we'd like you to understand that the technology does not allow for your provider to perform an examination, and thus may limit your provider's ability to fully assess your condition. If your provider identifies any concerns that need to be evaluated in person, we will make arrangements to do so.  Finally, though the technology is pretty good, we cannot assure that it will always work on either your or our end, and in the setting of a video visit, we may have to convert it to a phone-only visit.  In either situation, we cannot ensure that we have a secure connection.  Are you willing to proceed?" STAFF: Did the patient verbally acknowledge consent to telehealth visit? Document  YES/NO here: YES  4. Advise patient to be prepared - "Two hours prior to your appointment, go ahead and check your blood pressure, pulse, oxygen saturation, and your weight (if you have the equipment to check those) and write them all down. When your visit starts, your provider will ask you for this information. If you have an Apple Watch or Kardia device, please plan to have heart rate information ready on the day of your appointment. Please have a pen and paper handy nearby the day of the visit as well."  5. Give patient instructions for MyChart download to smartphone OR Doximity/Doxy.me as below if video visit (depending on what platform provider is using)  6. Inform patient they will receive a phone call 15 minutes prior to their appointment time (may be from unknown caller ID) so they should be prepared to answer    TELEPHONE CALL NOTE  [name] has been deemed a candidate for a follow-up tele-health visit to limit community exposure during the Covid-19 pandemic. I spoke with the patient via phone to ensure availability of phone/video source, confirm preferred email & phone number, and discuss instructions and expectations.  I reminded [name] to be prepared with any vital sign and/or heart rhythm information that could potentially be obtained via home monitoring, at the time of his visit. I reminded [name] to expect a phone call prior to  his visit.  Dorris Fetch, CMA 07/09/2019 7:53 AM   INSTRUCTIONS FOR DOWNLOADING THE MYCHART APP TO SMARTPHONE  - The patient must first make sure to have activated MyChart and know their login information - If Apple, go to [employer] and type in MyChart in the search bar and download the app. If Android, ask patient to go to [employer] and type in [place] in the search bar and download the app. The app is free but as with any other app downloads, their phone may require them to verify saved payment information or  Apple/Android password.  - The patient will need to then log into the app with their MyChart username and password, and select  as their healthcare provider to link the account. When it is time for your visit, go to the MyChart app, find appointments, and click Begin Video Visit. Be sure to Select Allow for your device to access the Microphone and Camera for your visit. You will then be connected, and your provider will be with you shortly.  **If they have any issues connecting, or need assistance please contact MyChart service desk [phone])**  **If using a computer, in order to ensure the best quality for their visit they will need to use either of the following Internet Browsers: [employer] or Google Chrome**  IF USING DOXIMITY or DOXY.ME - The patient will receive a link just prior to their visit by text.     FULL LENGTH CONSENT FOR TELE-HEALTH VISIT   I hereby voluntarily request, consent and authorize [facility] and its employed or contracted physicians, physician assistants, nurse practitioners or other licensed health care professionals (the Practitioner), to provide me with telemedicine health care services (the "Services") as deemed necessary by the treating Practitioner. I acknowledge and consent to receive the Services by the Practitioner via telemedicine. I understand that the telemedicine visit will involve communicating with the Practitioner through live audiovisual communication technology and the disclosure of certain medical information by electronic transmission. I acknowledge that I have been given the opportunity to request an in-person assessment or other available alternative prior to the telemedicine visit and am voluntarily participating in the telemedicine visit.  I understand that I have the right to withhold or withdraw my consent to the use of telemedicine in the course of my care at any time, without affecting my right to future care  or treatment, and that the Practitioner or I may terminate the telemedicine visit at any time. I understand that I have the right to inspect all information obtained and/or recorded in the course of the telemedicine visit and may receive copies of available information for a reasonable fee.  I understand that some of the potential risks of receiving the Services via telemedicine include:  Marland Kitchen Delay or interruption in medical evaluation due to technological equipment failure or disruption; . Information transmitted may not be sufficient (e.g. poor resolution of images) to allow for appropriate medical decision making by the Practitioner; and/or  . In rare instances, security protocols could fail, causing a breach of personal health information.  Furthermore, I acknowledge that it is my responsibility to provide information about my medical history, conditions and care that is complete and accurate to the best of my ability. I acknowledge that Practitioner's advice, recommendations, and/or decision may be based on factors not within their control, such as incomplete or inaccurate data provided by me or distortions of diagnostic images or specimens that may result from electronic transmissions. I  understand that the practice of medicine is not an exact science and that Practitioner makes no warranties or guarantees regarding treatment outcomes. I acknowledge that I will receive a copy of this consent concurrently upon execution via email to the email address I last provided but may also request a printed copy by calling the office of [facility].    I understand that my insurance will be billed for this visit.   I have read or had this consent read to me. . I understand the contents of this consent, which adequately explains the benefits and risks of the Services being provided via telemedicine.  . I have been provided ample opportunity to ask questions regarding this consent and the Services and have had  my questions answered to my satisfaction. . I give my informed consent for the services to be provided through the use of telemedicine in my medical care  By participating in this telemedicine visit I agree to the above.

## 2019-07-09 NOTE — Telephone Encounter (Signed)
Called patient to discuss AVS instructions gave Luke Kilroy's recommendations and patient voiced understanding. AVS summary mailed to patient.    

## 2019-08-14 ENCOUNTER — Other Ambulatory Visit: Payer: Self-pay | Admitting: Cardiology

## 2019-09-28 ENCOUNTER — Other Ambulatory Visit: Payer: Self-pay | Admitting: Cardiology

## 2019-10-08 ENCOUNTER — Other Ambulatory Visit: Payer: Self-pay | Admitting: Cardiology

## 2020-01-05 ENCOUNTER — Other Ambulatory Visit: Payer: Self-pay | Admitting: Cardiology

## 2020-07-01 ENCOUNTER — Ambulatory Visit (INDEPENDENT_AMBULATORY_CARE_PROVIDER_SITE_OTHER): Payer: Medicare Other | Admitting: Cardiology

## 2020-07-01 ENCOUNTER — Other Ambulatory Visit: Payer: Self-pay

## 2020-07-01 VITALS — BP 120/62 | HR 73 | Ht 68.0 in | Wt 172.6 lb

## 2020-07-01 DIAGNOSIS — E785 Hyperlipidemia, unspecified: Secondary | ICD-10-CM

## 2020-07-01 DIAGNOSIS — E119 Type 2 diabetes mellitus without complications: Secondary | ICD-10-CM

## 2020-07-01 DIAGNOSIS — I10 Essential (primary) hypertension: Secondary | ICD-10-CM

## 2020-07-01 DIAGNOSIS — R55 Syncope and collapse: Secondary | ICD-10-CM

## 2020-07-01 DIAGNOSIS — I2510 Atherosclerotic heart disease of native coronary artery without angina pectoris: Secondary | ICD-10-CM

## 2020-07-01 DIAGNOSIS — Z8679 Personal history of other diseases of the circulatory system: Secondary | ICD-10-CM | POA: Diagnosis not present

## 2020-07-01 DIAGNOSIS — E1169 Type 2 diabetes mellitus with other specified complication: Secondary | ICD-10-CM | POA: Diagnosis not present

## 2020-07-01 DIAGNOSIS — Z9861 Coronary angioplasty status: Secondary | ICD-10-CM

## 2020-07-01 NOTE — Progress Notes (Signed)
Primary Care Provider: [facility] Cardiologist: Bryan Lemma, MD Electrophysiologist: None  Clinic Note: Chief Complaint  Patient presents with  . Follow-up    No issues.  . Coronary Artery Disease    No angina  . Palpitations    History of SVT, no breakthrough   ===================================  ASSESSMENT/PLAN   Problem List Items Addressed This Visit    Non-insulin dependent type 2 diabetes mellitus (HCC)   Essential hypertension (Chronic)    Blood pressure well controlled today on current dose of ACE inhibitor and beta-blocker.  In the past he was on Avapro, was switched to ACE inhibitor.      Hyperlipidemia associated with type 2 diabetes mellitus (HCC) (Chronic)    Labs have been well controlled on current dose of atorvastatin.Marland Kitchen  He is due to get his labs checked at his PCPs office next month.  We will simply look for those labs on chart review.  If his LDL is below 50, would probably reduce atorvastatin dose to 40 mg.  On Metformin, could consider adding SGLT2 inhibitor or potentially GLP-1 agonist.      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  He does note occasional flip-flopping skipped beats, nothing prolonged. Plan: We will switch his Toprol to evening dose because most of his palpitations occur at night.      History of PSVT (paroxysmal supraventricular tachycardia) (Chronic)    No further breakthrough episodes of SVT.  Remains on beta-blocker.  Stable      Relevant Orders   EKG 12-Lead (Completed)   CAD S/P percutaneous coronary angioplasty - Primary (Chronic)    Distant history of CAD with PCI.  No further anginal symptoms.  Likely short, large caliber stent. Remains on lifelong maintenance Plavix along with beta-blocker, ACE inhibitor and statin.  As long as it is active, not have any symptoms, no major issues.      Relevant Orders    EKG 12-Lead (Completed)     ===================================  HPI:    [name] is a 70 y.o. male with a PMH below who presents today for annual follow-up.  [name] was last by me in person back in March 2020.  He was seen by Mr. Corine Shelter, [place] on July 09, 2019 via telemedicine.  Stays active on his scalp form.  No syncopal episodes.  Occasional chest discomfort more related to reflux.  He had just had LDL check-was 44 in August 2020.  Recent Hospitalizations: None  Reviewed  CV studies:    The following studies were reviewed today: (if available, images/films reviewed: From Epic Chart or Care Everywhere) . None:   Interval History:   [name] returns here today for annual follow-up as an in person visit.  He is doing pretty well.  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  He will get somewhat short of breath or exertional dyspnea if he is going fast uphill, or upstairs.  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  No chest pain or pressure.  He still has GERD symptoms.  He has off-and-on palpitations that last few seconds, and do not bother him very much.  A lot of these palpitation episodes happen in the evening.  Overall from our standpoint he is quite stable.  No major issues.  Nothing to suggest prolonged episodes of PSVT.  No further anginal symptoms.  No  PND, orthopnea or edema.  CV Review of Symptoms (Summary): no chest pain or dyspnea on exertion positive for - He only gets short of breath if he overexerts, not with routine activity.  Rare palpitations. negative for - edema, orthopnea, paroxysmal nocturnal dyspnea, rapid heart rate, shortness of breath or Lightheadedness or dizziness, wooziness or syncope/near syncope, TIA/amaurosis fugax, claudication He remains very active, no other major issues.   ROS: positive for - dyspnea on exertion  The patient does not have symptoms concerning for COVID-19 infection (fever, chills, cough, or new shortness of  breath).   REVIEWED OF SYSTEMS   Review of Systems  Constitutional: Negative for malaise/fatigue and weight loss.  Respiratory: Negative for cough and shortness of breath.   Cardiovascular: Negative for leg swelling.  Gastrointestinal: Positive for constipation (Off-and-on). Negative for blood in stool.  Genitourinary: Negative for hematuria.  Musculoskeletal: Negative for joint pain.  Neurological: Negative for dizziness.  Psychiatric/Behavioral: Negative for memory loss. The patient is nervous/anxious. The patient does not have insomnia.    I have reviewed and (if needed) personally updated the patient's problem list, medications, allergies, past medical and surgical history, social and family history.   PAST MEDICAL HISTORY   Past Medical History:  Diagnosis Date  . Arthritis   . Borderline diabetes   . CAD S/P percutaneous coronary angioplasty 2005   s/p PCI of LAD 3.0x2mm Promus DES; ReCath in 07/2010 & 12/2012: Widely patent LAD stent with minimal disease elsewhere, Normal EF ; negative Myoview in January 2014  . Dyslipidemia   . Essential hypertension   . GERD (gastroesophageal reflux disease)   . History of PSVT (paroxysmal supraventricular tachycardia)   . History of syncope Jan 2014   s/p Loop Recorder - no positive etiology noted.  . Right clavicle fracture   . Seizures (HCC)    in teenage year's    PAST SURGICAL HISTORY   Past Surgical History:  Procedure Laterality Date  . CARDIAC CATHETERIZATION  2005   noncritical CAD, aneursym in prox LAD, normal LV systolic function  . CARDIAC CATHETERIZATION  2/28//2012   3.0x90mm Promus DES to LAD (repeat cath 07/2010 by Dr. Julieanne Manson showed patency of stent)  . CARDIAC CATHETERIZATION  12/2012   widely patent LAD stent; minimal CAD elsewhere; patulous LAD  . COLONOSCOPY W/ BIOPSIES AND POLYPECTOMY    . HERNIA REPAIR  2005  . LEFT HEART CATHETERIZATION WITH CORONARY ANGIOGRAM N/A 01/24/2013   Marykay Lex, MD;   Location: [facility] CATH LAB;  Service: Cardiovascular;: Widely patent LAD stent, minmal CAD elsewhere, normal EF  . LOOP RECORDER EXPLANT N/A 03/25/2013   Procedure: LOOP RECORDER EXPLANT;  Surgeon: Thurmon Fair, MD;  Location: [facility] CATH LAB;  Service: Cardiovascular;  Laterality: N/A;  . LOOP RECORDER IMPLANT  05/2012   Medtronic Reveal XT - placed for near-syncopal episodes by Dr. Judie Petit. Croitoru  . LOOP RECORDER IMPLANT N/A 05/11/2012   Procedure: LOOP RECORDER IMPLANT;  Surgeon: Thurmon Fair, MD;  Location: [facility] CATH LAB;  Service: Cardiovascular;  Laterality: N/A;  . NM MYOVIEW LTD  05/2012   Normal Stress Test - No ischemia or Infarction, EF 62%  . ORIF CLAVICULAR FRACTURE Right 01/10/2014   Procedure: OPEN REDUCTION INTERNAL FIXATION (ORIF) RIGHT  CLAVICULAR FRACTURE;  Surgeon: Verlee Rossetti, MD;  Location: [facility] OR;  Service: Orthopedics;  Laterality: Right;  . TRANSTHORACIC ECHOCARDIOGRAM  05/2012   EF 55-60%, normal wall motion, no valvular abnormalities - ordered for syncope     There  is no immunization history on file for this patient.  MEDICATIONS/ALLERGIES   No outpatient medications have been marked as taking for the 07/01/20 encounter (Office Visit) with Marykay LexHarding, Breckyn Ticas W, MD.    No Known Allergies  SOCIAL HISTORY/FAMILY HISTORY   Reviewed in Epic:  Pertinent findings:  Social History   Tobacco Use  . Smoking status: Never Smoker  . Smokeless tobacco: Never Used  Substance Use Topics  . Alcohol use: No  . Drug use: No   Social History   Social History Narrative    He is a married father of 2, grandfather of 4. . 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  He is very active around the farm.      No alcohol. Nonsmoker    OBJCTIVE -PE, EKG, labs   Wt Readings from Last 3 Encounters:  07/01/20 172 lb 9.6 oz (78.3 kg)  07/09/19 175 lb (79.4 kg)  07/09/18 181 lb  12.8 oz (82.5 kg)    Physical Exam: BP 120/62 (BP Location: Left Arm, Patient Position: Sitting, Cuff Size: Large)   Pulse 73   Ht 5\' 8"  (1.727 m)   Wt 172 lb 9.6 oz (78.3 kg)   BMI 26.24 kg/m  Physical Exam Vitals reviewed.  Constitutional:      General: He is not in acute distress.    Appearance: Normal appearance. He is not ill-appearing (Well-groomed.  Healthy-appearing) or toxic-appearing.  HENT:     Head: Normocephalic and atraumatic.  Neck:     Vascular: No carotid bruit, hepatojugular reflux or JVD.  Cardiovascular:     Rate and Rhythm: Normal rate and regular rhythm.  No extrasystoles are present.    Chest Wall: PMI is not displaced.     Pulses: Normal pulses and intact distal pulses.     Heart sounds: Normal heart sounds. No murmur heard. No friction rub. No gallop.   Pulmonary:     Effort: Pulmonary effort is normal. No respiratory distress.     Breath sounds: Normal breath sounds.  Musculoskeletal:        General: No swelling. Normal range of motion.     Cervical back: Normal range of motion and neck supple.  Skin:    General: Skin is warm and dry.  Neurological:     General: No focal deficit present.     Mental Status: He is alert and oriented to person, place, and time.  Psychiatric:        Mood and Affect: Mood normal.        Thought Content: Thought content normal.        Judgment: Judgment normal.     Adult ECG Report  Rate: 73 ;  Rhythm: normal sinus rhythm and Incomplete RBBB.  LAD-55) with LVH;   Narrative Interpretation: Stable  Recent Labs: He is due for labs to be checked by PCP next month. Lab Results  Component Value Date   CHOL 98 (L) 12/19/2018   HDL 37 (L) 12/19/2018   LDLCALC 44 12/19/2018   TRIG 83 12/19/2018   CHOLHDL 2.6 12/19/2018   Lab Results  Component Value Date   CREATININE 1.00 07/11/2017   BUN 14 07/11/2017   NA 139 07/11/2017   K 4.5 07/11/2017   CL 102 07/11/2017   CO2 23 07/11/2017   CBC Latest Ref Rng & Units  01/10/2014  01/24/2013 01/23/2013  WBC 4.0 - 10.5 K/uL 7.0 5.1 5.7  Hemoglobin 13.0 - 17.0 g/dL 81.1 03.1 59.4  Hematocrit 39.0 - 52.0 % 44.5 41.7 40.7  Platelets 150 - 400 K/uL 198 199 200    Lab Results  Component Value Date   TSH 1.070 07/11/2017    ==================================================  COVID-19 Education: The signs and symptoms of COVID-19 were discussed with the patient and how to seek care for testing (follow up with PCP or arrange E-visit).   The importance of social distancing and COVID-19 vaccination was discussed today. The patient is practicing social distancing & Masking.   I spent a total of with the patient spent in direct patient consultation.  Additional time spent with chart review  / charting (studies, outside notes, etc): 15 min Total Time: 31 min   Current medicines are reviewed at length with the patient today.  (+/- concerns) n/a  This visit occurred during the SARS-CoV-2 public health emergency.  Safety protocols were in place, including screening questions prior to the visit, additional usage of staff PPE, and extensive cleaning of exam room while observing appropriate contact time as indicated for disinfecting solutions.  Notice: This dictation was prepared with Dragon dictation along with smaller phrase technology. Any transcriptional errors that result from this process are unintentional and may not be corrected upon review.  Patient Instructions / Medication Changes & Studies & Tests Ordered   Patient Instructions  Medication Instructions:  No changes  *If you need a refill on your cardiac medications before your next appointment, please call your pharmacy*   Lab Work: Not needed at present , but have your primary order--  lipid panel and liver panel when you get your next  Labs drawn there. If the labs are done prior to next visit call office  To have them ordered     Testing/Procedures: Not needed   Follow-Up: At  [facility], you and your health needs are our priority.  As part of our continuing mission to provide you with exceptional heart care, we have created designated Provider Care Teams.  These Care Teams include your primary Cardiologist (physician) and Advanced Practice Providers (APPs -  Physician Assistants and Nurse Practitioners) who all work together to provide you with the care you need, when you need it.  We recommend signing up for the patient portal called "MyChart".  Sign up information is provided on this After Visit Summary.  MyChart is used to connect with patients for Virtual Visits (Telemedicine).  Patients are able to view lab/test results, encounter notes, upcoming appointments, etc.  Non-urgent messages can be sent to your provider as well.   To learn more about what you can do with MyChart, go to [email or web address].    Your next appointment:   12 month(s)  The format for your next appointment:   In Person  Provider:   Bryan Lemma, MD     Studies Ordered:   Orders Placed This Encounter  Procedures  . EKG 12-Lead     Bryan Lemma, M.D., M.S. Interventional Cardiologist   Pager # [phone] Phone # [phone] [place], [place] [address]   Thank you for choosing Heartcare at [facility]!!

## 2020-07-01 NOTE — Patient Instructions (Signed)
Medication Instructions:  No changes  *If you need a refill on your cardiac medications before your next appointment, please call your pharmacy*   Lab Work: Not needed at present , but have your primary order--  lipid panel and liver panel when you get your next  Labs drawn there. If the labs are done prior to next visit call office  To have them ordered     Testing/Procedures: Not needed   Follow-Up: At [facility], you and your health needs are our priority.  As part of our continuing mission to provide you with exceptional heart care, we have created designated Provider Care Teams.  These Care Teams include your primary Cardiologist (physician) and Advanced Practice Providers (APPs -  Physician Assistants and Nurse Practitioners) who all work together to provide you with the care you need, when you need it.  We recommend signing up for the patient portal called "MyChart".  Sign up information is provided on this After Visit Summary.  MyChart is used to connect with patients for Virtual Visits (Telemedicine).  Patients are able to view lab/test results, encounter notes, upcoming appointments, etc.  Non-urgent messages can be sent to your provider as well.   To learn more about what you can do with MyChart, go to [email or web address].    Your next appointment:   12 month(s)  The format for your next appointment:   In Person  Provider:   Bryan Lemma, MD

## 2020-07-10 ENCOUNTER — Other Ambulatory Visit: Payer: Self-pay | Admitting: Cardiology

## 2020-07-27 ENCOUNTER — Encounter: Payer: Self-pay | Admitting: Cardiology

## 2020-07-27 NOTE — Assessment & Plan Note (Signed)
No further episodes since he had the ILR placed.  He does note occasional flip-flopping skipped beats, nothing prolonged. Plan: We will switch his Toprol to evening dose because most of his palpitations occur at night.

## 2020-07-27 NOTE — Assessment & Plan Note (Signed)
No further breakthrough episodes of SVT.  Remains on beta-blocker.  Stable

## 2020-07-27 NOTE — Assessment & Plan Note (Signed)
Distant history of CAD with PCI.  No further anginal symptoms.  Likely short, large caliber stent. Remains on lifelong maintenance Plavix along with beta-blocker, ACE inhibitor and statin.  As long as it is active, not have any symptoms, no major issues.

## 2020-07-27 NOTE — Assessment & Plan Note (Addendum)
Labs have been well controlled on current dose of atorvastatin.Marland Kitchen  He is due to get his labs checked at his PCPs office next month.  We will simply look for those labs on chart review.  If his LDL is below 50, would probably reduce atorvastatin dose to 40 mg.  On Metformin, could consider adding SGLT2 inhibitor or potentially GLP-1 agonist.

## 2020-07-27 NOTE — Assessment & Plan Note (Signed)
Blood pressure well controlled today on current dose of ACE inhibitor and beta-blocker.  In the past he was on Avapro, was switched to ACE inhibitor.

## 2020-08-12 ENCOUNTER — Telehealth: Payer: Self-pay | Admitting: Cardiology

## 2020-08-12 MED ORDER — RAMIPRIL 5 MG PO CAPS: ORAL_CAPSULE | ORAL | 3 refills | Status: DC

## 2020-08-12 NOTE — Telephone Encounter (Signed)
*  STAT* If patient is at the pharmacy, call can be transferred to refill team.   1. Which medications need to be refilled? (please list name of each medication and dose if known)   ramipril (ALTACE) 5 MG capsule     2. Which pharmacy/location (including street and city if local pharmacy) is medication to be sent to?[id number] - [place], [place] - [address]  3. Do they need a 30 day or 90 day supply? 90  PT is currently out of this medication.

## 2020-10-07 ENCOUNTER — Other Ambulatory Visit: Payer: Self-pay | Admitting: Cardiology

## 2020-12-30 ENCOUNTER — Other Ambulatory Visit: Payer: Self-pay | Admitting: Cardiology

## 2021-01-11 ENCOUNTER — Emergency Department
Admission: EM | Admit: 2021-01-11 | Discharge: 2021-01-11 | Disposition: A | Discharge status: Home / Self Care | Payer: Medicare Other | Attending: Emergency Medicine | Admitting: Emergency Medicine

## 2021-01-11 ENCOUNTER — Other Ambulatory Visit: Payer: Self-pay

## 2021-01-11 ENCOUNTER — Emergency Department: Payer: Medicare Other

## 2021-01-11 DIAGNOSIS — Z7902 Long term (current) use of antithrombotics/antiplatelets: Secondary | ICD-10-CM | POA: Diagnosis not present

## 2021-01-11 DIAGNOSIS — S99912A Unspecified injury of left ankle, initial encounter: Secondary | ICD-10-CM | POA: Diagnosis present

## 2021-01-11 DIAGNOSIS — E119 Type 2 diabetes mellitus without complications: Secondary | ICD-10-CM | POA: Insufficient documentation

## 2021-01-11 DIAGNOSIS — M25572 Pain in left ankle and joints of left foot: Secondary | ICD-10-CM | POA: Insufficient documentation

## 2021-01-11 DIAGNOSIS — Y9389 Activity, other specified: Secondary | ICD-10-CM | POA: Diagnosis not present

## 2021-01-11 DIAGNOSIS — I2510 Atherosclerotic heart disease of native coronary artery without angina pectoris: Secondary | ICD-10-CM | POA: Diagnosis not present

## 2021-01-11 DIAGNOSIS — S82842A Displaced bimalleolar fracture of left lower leg, initial encounter for closed fracture: Secondary | ICD-10-CM | POA: Insufficient documentation

## 2021-01-11 DIAGNOSIS — I10 Essential (primary) hypertension: Secondary | ICD-10-CM | POA: Diagnosis not present

## 2021-01-11 DIAGNOSIS — S82892A Other fracture of left lower leg, initial encounter for closed fracture: Secondary | ICD-10-CM

## 2021-01-11 DIAGNOSIS — Z79899 Other long term (current) drug therapy: Secondary | ICD-10-CM | POA: Insufficient documentation

## 2021-01-11 DIAGNOSIS — Z7984 Long term (current) use of oral hypoglycemic drugs: Secondary | ICD-10-CM | POA: Diagnosis not present

## 2021-01-11 DIAGNOSIS — W5529XA Other contact with cow, initial encounter: Secondary | ICD-10-CM | POA: Insufficient documentation

## 2021-01-11 MED ORDER — OXYCODONE-ACETAMINOPHEN 5-325 MG PO TABS
1.0000 | ORAL_TABLET | Freq: Once | ORAL | Status: AC
  Administered 2021-01-11: 1 via ORAL
  Filled 2021-01-11: qty 1

## 2021-01-11 MED ORDER — OXYCODONE-ACETAMINOPHEN 5-325 MG PO TABS: 1.0000 | ORAL_TABLET | ORAL | 0 refills | Status: DC | PRN

## 2021-01-11 NOTE — ED Provider Notes (Signed)
[facility] Emergency Department Provider Note  Time seen: 2:51 PM  I have reviewed the triage vital signs and the nursing notes.   HISTORY  Chief Complaint Ankle Pain   HPI [name] is a 70 y.o. male with a past medical history of arthritis, hypertension, gastric reflux, CAD, presents to the emergency department for left ankle pain.  According to the patient he was at a cattle auction when they were trying to load one of the cows which knocked him over and then stepped on his ankle.  Patient states immediate pain to the ankle has been unable to ambulate on the ankle since.  Patient describes moderate pain currently.  Denies any other injuries.  Denies hitting his head.  Denies anticoagulation (record review shows Plavix).   Past Medical History:  Diagnosis Date   Arthritis    Borderline diabetes    CAD S/P percutaneous coronary angioplasty 2005   s/p PCI of LAD 3.0x16mm Promus DES; ReCath in 07/2010 & 12/2012: Widely patent LAD stent with minimal disease elsewhere, Normal EF ; negative Myoview in January 2014   Dyslipidemia    Essential hypertension    GERD (gastroesophageal reflux disease)    History of PSVT (paroxysmal supraventricular tachycardia)    History of syncope Jan 2014   s/p Loop Recorder - no positive etiology noted.   Right clavicle fracture    Seizures (HCC)    in teenage year's    Patient Active Problem List   Diagnosis Date Noted   Non-insulin dependent type 2 diabetes mellitus (HCC) 07/09/2019   GERD (gastroesophageal reflux disease) 06/08/2014   CAD S/P percutaneous coronary angioplasty    Left arm numbness 05/04/2012   Near syncope - s/p Loop Recorder implant, now removed 03/26/13  05/02/2012   Essential hypertension 04/19/2011   Hyperlipidemia associated with type 2 diabetes mellitus (HCC) 04/19/2011   History of PSVT (paroxysmal supraventricular tachycardia) 04/19/2011    Past Surgical History:  Procedure Laterality  Date   CARDIAC CATHETERIZATION  2005   noncritical CAD, aneursym in prox LAD, normal LV systolic function   CARDIAC CATHETERIZATION  2/28//2012   3.0x55mm Promus DES to LAD (repeat cath 07/2010 by Dr. Julieanne Manson showed patency of stent)   CARDIAC CATHETERIZATION  12/2012   widely patent LAD stent; minimal CAD elsewhere; patulous LAD   COLONOSCOPY W/ BIOPSIES AND POLYPECTOMY     HERNIA REPAIR  2005   LEFT HEART CATHETERIZATION WITH CORONARY ANGIOGRAM N/A 01/24/2013   Marykay Lex, MD;  Location[facility] C[facility] LAB;  Service: Cardiovascular;: Widely patent LAD stent, minmal CAD elsewhere, normal EF   LOOP RECORDER EXPLANT N/A 03/25/2013   Procedure: LOOP RECORDER EXPLANT;  Surgeon: Thurmon Fair, MD;  Location: [facility] CATH LAB;  Service: Cardiovascular;  Laterality: N/A;   LOOP RECORDER IMPLANT  05/2012   Medtronic Reveal XT - placed for near-syncopal episodes by Dr. Judie Petit. Croitoru   LOOP RECORDER IMPLANT N/A 05/11/2012   Procedure: LOOP RECORDER IMPLANT;  Surgeon: Thurmon Fair, MD;  Location: [facility] CATH LAB;  Service: Cardiovascular;  Laterality: N/A;   NM MYOVIEW LTD  05/2012   Normal Stress Test - No ischemia or Infarction, EF 62%   ORIF CLAVICULAR FRACTURE Right 01/10/2014   Procedure: OPEN REDUCTION INTERNAL FIXATION (ORIF) RIGHT  CLAVICULAR FRACTURE;  Surgeon: Verlee Rossetti, MD;  Location: [facility] OR;  Service: Orthopedics;  Laterality: Right;   TRANSTHORACIC ECHOCARDIOGRAM  05/2012   EF 55-60%, normal wall motion, no valvular abnormalities - ordered for  syncope    Prior to Admission medications   Medication Sig Start Date End Date Taking? Authorizing Provider  atorvastatin (LIPITOR) 80 MG tablet TAKE 1 TABLET BY MOUTH EVERY DAY 10/07/20   Marykay Lex, MD  clopidogrel (PLAVIX) 75 MG tablet TAKE 1 TABLET (75 MG TOTAL) BY MOUTH EVERY MORNING. 12/30/20   Marykay Lex, MD  Cyanocobalamin (QC VITAMIN B12 PO) Take by mouth.    [provider]  IRON PO Take by mouth every other day.     [provider]  metFORMIN (GLUCOPHAGE) 500 MG tablet Take 1 tablet by mouth 2 (two) times daily. 03/19/18   [provider]  metoprolol succinate (TOPROL-XL) 25 MG 24 hr tablet TAKE 1 TABLET BY MOUTH EVERY DAY 07/10/20   Marykay Lex, MD  nitroGLYCERIN (NITROSTAT) 0.4 MG SL tablet Place 1 tablet (0.4 mg total) under the tongue every 5 (five) minutes as needed. For chest pain 06/06/14   Marykay Lex, MD  pantoprazole (PROTONIX) 40 MG tablet Take 40 mg by mouth daily.    [provider]  ramipril (ALTACE) 5 MG capsule TAKE 1 CAPSULE BY MOUTH EVERY DAY 08/12/20   Marykay Lex, MD  VITAMIN D, CHOLECALCIFEROL, PO Take by mouth.    [provider]    No Known Allergies  Family History  Problem Relation Age of Onset   Heart disease Mother    Hypertension Mother    Heart attack Father    Cancer Maternal Grandfather    Leukemia Maternal Grandfather    Stroke Paternal Grandmother    Heart attack Brother     Social History Social History   Tobacco Use   Smoking status: Never   Smokeless tobacco: Never  Substance Use Topics   Alcohol use: No   Drug use: No    Review of Systems Constitutional: Negative for fever. Cardiovascular: Negative for chest pain. Respiratory: Negative for shortness of breath. Gastrointestinal: Negative for abdominal pain, vomiting  Musculoskeletal: Moderate left ankle pain and swelling Skin: Left ankle pain and swelling.  No laceration. Neurological: Negative for headache All other ROS negative  ____________________________________________   PHYSICAL EXAM:  VITAL SIGNS: ED Triage Vitals  Enc Vitals Group     BP 01/11/21 1353 (!) 155/89     Pulse Rate 01/11/21 1353 83     Resp 01/11/21 1353 18     Temp 01/11/21 1353 98.5 F (36.9 C)     Temp Source 01/11/21 1353 Oral     SpO2 01/11/21 1353 99 %     Weight 01/11/21 1405 172 lb 9.9 oz (78.3 kg)     Height 01/11/21 1405 5\' 8"  (1.727 m)     Head  Circumference --      Peak Flow --      Pain Score --      Pain Loc --      Pain Edu? --      Excl. in GC? --    Constitutional: Alert and oriented. Well appearing and in no distress. Eyes: Normal exam ENT      Head: Normocephalic and atraumatic.      Mouth/Throat: Mucous membranes are moist. Cardiovascular: Normal rate, regular rhythm.  Respiratory: Normal respiratory effort without tachypnea nor retractions. Breath sounds are clear  Gastrointestinal: Soft and nontender. No distention. Musculoskeletal: Patient has moderate edema to both the medial as well as lateral aspects of the left ankle with mild to moderate tenderness to palpation of the area.  Neurovascular intact  distally. Neurologic:  Normal speech and language. No gross focal neurologic deficits  Skin:  Skin is warm, dry and intact.  Psychiatric: Mood and affect are normal.   ____________________________________________     RADIOLOGY  X-ray shows bimalleolar fracture  ____________________________________________   INITIAL IMPRESSION / ASSESSMENT AND PLAN / ED COURSE  Pertinent labs & imaging results that were available during my care of the patient were reviewed by me and considered in my medical decision making (see chart for details).   Patient presents emergency department after left ankle injury in which she was stepped on by a cow.  X-ray consistent bimalleolar fracture.  We will discussed with orthopedics for further recommendations.  I spoke with Dr. Ether Griffins of podiatry.  He will see the patient tomorrow at 4:00 in clinic we will place the patient in a short leg with stirrup splint, nonweightbearing with crutches.  We will discharge with pain medication and podiatry follow-up tomorrow with planning on surgery for Friday.  Patient agreeable to plan of care.  [name] was evaluated in Emergency Department on 01/11/2021 for the symptoms described in the history of present illness. He was evaluated in the  context of the global COVID-19 pandemic, which necessitated consideration that the patient might be at risk for infection with the SARS-CoV-2 virus that causes COVID-19. Institutional protocols and algorithms that pertain to the evaluation of patients at risk for COVID-19 are in a state of rapid change based on information released by regulatory bodies including the CDC and federal and state organizations. These policies and algorithms were followed during the patient's care in the ED.  ____________________________________________   FINAL CLINICAL IMPRESSION(S) / ED DIAGNOSES  Bimalleolar fracture of the left ankle   Minna Antis, MD 01/11/21 1549

## 2021-01-11 NOTE — Discharge Instructions (Signed)
Please go to your appointment at 4 PM tomorrow at podiatry for evaluation and likely surgical planning.  Please arrive 15 minutes early to your appointment.  Please take your pain medication as needed, as prescribed.  Return to the emergency department for any symptom personally concerning to yourself.

## 2021-01-11 NOTE — ED Triage Notes (Signed)
Pt states he was unloading cattle from a trailer at an auction and the cow trampled him, pt c/o left ankle pain and right knee pain. Pt is a/ox4, denies any other injuries at this time

## 2021-01-12 ENCOUNTER — Other Ambulatory Visit: Payer: Self-pay | Admitting: Podiatry

## 2021-01-13 ENCOUNTER — Telehealth: Payer: Self-pay | Admitting: Cardiology

## 2021-01-13 NOTE — Telephone Encounter (Signed)
   [facility] Pre-operative Risk Assessment    Patient Name: REAL CONA  DOB: 09-18-1950 MRN: [id number]  HEARTCARE STAFF:  - IMPORTANT!!!!!! Under Visit Info/Reason for Call, type in Other and utilize the format Clearance MM/DD/YY or Clearance TBD. Do not use dashes or single digits. - Please review there is not already an duplicate clearance open for this procedure. - If request is for dental extraction, please clarify the # of teeth to be extracted. - If the patient is currently at the dentist's office, call Pre-Op Callback Staff (MA/nurse) to input urgent request.  - If the patient is not currently in the dentist office, please route to the Pre-Op pool.  Request for surgical clearance:  What type of surgery is being performed? Ankle Surgery  When is this surgery scheduled? 01/17/21  What type of clearance is required (medical clearance vs. Pharmacy clearance to hold med vs. Both)? Both  Are there any medications that need to be held prior to surgery and how long? Plavix 2 days   Practice name and name of physician performing surgery? Dr. Samara Deist, [facility]   What is the office phone number? [phone]   7.   What is the office fax number? [phone]  8.   Anesthesia type (None, local, MAC, general) ? General    Johnna Acosta 01/13/2021, 11:10 AM  _________________________________________________________________   (provider comments below)

## 2021-01-13 NOTE — Telephone Encounter (Signed)
Patient was returning call 

## 2021-01-13 NOTE — Telephone Encounter (Signed)
Okay to hold Plavix 5 days preop.  Restart 2 to 3 days postop.   Bryan Lemma, MD

## 2021-01-13 NOTE — Telephone Encounter (Signed)
Dr. Herbie Baltimore I do not see documentation in your last 2 notes for permission for plavix hold for procedures.   Pt had CAD-PCI LAD 2005.  LAD stent noted to be patent on 2 relook Caths.  OK to hold plavix for 2 days prior to ankle surgery?

## 2021-01-13 NOTE — Telephone Encounter (Signed)
Left VM

## 2021-01-14 ENCOUNTER — Other Ambulatory Visit: Payer: Self-pay

## 2021-01-14 ENCOUNTER — Inpatient Hospital Stay
Admission: RE | Admit: 2021-01-14 | Discharge: 2021-01-14 | Disposition: A | Discharge status: Home / Self Care | Payer: Medicare Other | Source: Ambulatory Visit

## 2021-01-14 ENCOUNTER — Encounter
Admission: RE | Admit: 2021-01-14 | Discharge: 2021-01-14 | Disposition: A | Discharge status: Home / Self Care | Payer: Medicare Other | Source: Ambulatory Visit | Attending: Podiatry | Admitting: Podiatry

## 2021-01-14 DIAGNOSIS — I10 Essential (primary) hypertension: Secondary | ICD-10-CM | POA: Diagnosis not present

## 2021-01-14 DIAGNOSIS — Z20822 Contact with and (suspected) exposure to covid-19: Secondary | ICD-10-CM | POA: Diagnosis not present

## 2021-01-14 DIAGNOSIS — I2510 Atherosclerotic heart disease of native coronary artery without angina pectoris: Secondary | ICD-10-CM | POA: Diagnosis not present

## 2021-01-14 DIAGNOSIS — Z01818 Encounter for other preprocedural examination: Secondary | ICD-10-CM | POA: Diagnosis present

## 2021-01-14 HISTORY — DX: Type 2 diabetes mellitus without complications: E11.9

## 2021-01-14 LAB — CBC
HCT: 38.7 % — ABNORMAL LOW (ref 39.0–52.0)
Hemoglobin: 13.7 g/dL (ref 13.0–17.0)
MCH: 30.9 pg (ref 26.0–34.0)
MCHC: 35.4 g/dL (ref 30.0–36.0)
MCV: 87.2 fL (ref 80.0–100.0)
Platelets: 208 10*3/uL (ref 150–400)
RBC: 4.44 MIL/uL (ref 4.22–5.81)
RDW: 12.1 % (ref 11.5–15.5)
WBC: 7.5 10*3/uL (ref 4.0–10.5)
nRBC: 0 % (ref 0.0–0.2)

## 2021-01-14 LAB — BASIC METABOLIC PANEL
Anion gap: 8 (ref 5–15)
BUN: 22 mg/dL (ref 8–23)
CO2: 25 mmol/L (ref 22–32)
Calcium: 9.1 mg/dL (ref 8.9–10.3)
Chloride: 102 mmol/L (ref 98–111)
Creatinine, Ser: 0.96 mg/dL (ref 0.61–1.24)
GFR, Estimated: >60 mL/min (ref >60)
Glucose, Bld: 116 mg/dL — ABNORMAL HIGH (ref 70–99)
Potassium: 4.2 mmol/L (ref 3.5–5.1)
Sodium: 135 mmol/L (ref 135–145)

## 2021-01-14 NOTE — Patient Instructions (Signed)
Your procedure is scheduled on: Report to the Registration Desk on the 1st floor of the [facility]. To find out your arrival time, please call [phone] between 1PM - 3PM on:  REMEMBER: Instructions that are not followed completely may result in serious medical risk, up to and including death; or upon the discretion of your surgeon and anesthesiologist your surgery may need to be rescheduled.  Do not eat food after midnight the night before surgery.  No gum chewing, lozengers or hard candies.  You may however, drink CLEAR liquids up to 2 hours before you are scheduled to arrive for your surgery. Do not drink anything within 2 hours of your scheduled arrival time.  Clear liquids include: - water  - apple juice without pulp - gatorade (not RED, PURPLE, OR BLUE) - black coffee or tea (Do NOT add milk or creamers to the coffee or tea) Do NOT drink anything that is not on this list.  Type 1 and Type 2 diabetics should only drink water.  In addition, your doctor has ordered for you to drink the provided  Ensure Pre-Surgery Clear Carbohydrate Drink  Gatorade G2 Drinking this carbohydrate drink up to two hours before surgery helps to reduce insulin resistance and improve patient outcomes. Please complete drinking 2 hours prior to scheduled arrival time.  TAKE THESE MEDICATIONS THE MORNING OF SURGERY WITH A SIP OF WATER:  Atorvastatin Metoprolol Pantoprazole Oxycodone if needed for pain  (take one the night before and one on the morning of surgery - helps to prevent nausea after surgery.)  Use inhalers on the day of surgery and bring to the hospital.  **Follow new guidelines for insulin and diabetes medications.**  Follow recommendations from Cardiologist, Pulmonologist or PCP regarding stopping Aspirin, Coumadin, Plavix, Eliquis, Pradaxa, or Pletal.  One week prior to surgery: Stop Anti-inflammatories (NSAIDS) such as Advil, Aleve, Ibuprofen, Motrin, Naproxen, Naprosyn and  Aspirin based products such as Excedrin, Goodys Powder, BC Powder. Stop ANY OVER THE COUNTER supplements until after surgery. You may however, continue to take Tylenol if needed for pain up until the day of surgery.  No Alcohol for 24 hours before or after surgery.  No Smoking including e-cigarettes for 24 hours prior to surgery.  No chewable tobacco products for at least 6 hours prior to surgery.  No nicotine patches on the day of surgery.  Do not use any "recreational" drugs for at least a week prior to your surgery.  Please be advised that the combination of cocaine and anesthesia may have negative outcomes, up to and including death. If you test positive for cocaine, your surgery will be cancelled.  On the morning of surgery brush your teeth with toothpaste and water, you may rinse your mouth with mouthwash if you wish. Do not swallow any toothpaste or mouthwash.  Use CHG Soap or wipes as directed on instruction sheet.  Do not wear jewelry, make-up, hairpins, clips or nail polish.  Do not wear lotions, powders, or perfumes.   Do not shave body from the neck down 48 hours prior to surgery just in case you cut yourself which could leave a site for infection.  Also, freshly shaved skin may become irritated if using the CHG soap.  Contact lenses, hearing aids and dentures may not be worn into surgery.  Do not bring valuables to the hospital. [facility] is not responsible for any missing/lost belongings or valuables.   Total Shoulder Arthroplasty:  use Benzolyl Peroxide 5% Gel as directed on instruction  sheet.  Fleets enema or bowel prep as directed.  Bring your C-PAP to the hospital with you in case you may have to spend the night.   Notify your doctor if there is any change in your medical condition (cold, fever, infection).  Wear comfortable clothing (specific to your surgery type) to the hospital.  After surgery, you can help prevent lung complications by doing breathing  exercises.  Take deep breaths and cough every 1-2 hours. Your doctor may order a device called an Incentive Spirometer to help you take deep breaths. When coughing or sneezing, hold a pillow firmly against your incision with both hands. This is called "splinting." Doing this helps protect your incision. It also decreases belly discomfort.  If you are being admitted to the hospital overnight, leave your suitcase in the car. After surgery it may be brought to your room.  If you are being discharged the day of surgery, you will not be allowed to drive home. You will need a responsible adult (18 years or older) to drive you home and stay with you that night.   If you are taking public transportation, you will need to have a responsible adult (18 years or older) with you. Please confirm with your physician that it is acceptable to use public transportation.   Please call the Pre-admissions Testing Dept. at [phone] if you have any questions about these instructions.  Surgery Visitation Policy:  Patients undergoing a surgery or procedure may have one family member or support person with them as long as that person is not COVID-19 positive or experiencing its symptoms.  That person may remain in the waiting area during the procedure.  Inpatient Visitation:    Visiting hours are 7 a.m. to 8 p.m. Inpatients will be allowed two visitors daily. The visitors may change each day during the patient's stay. No visitors under the age of 42. Any visitor under the age of 5 must be accompanied by an adult. The visitor must pass COVID-19 screenings, use hand sanitizer when entering and exiting the patient's room and wear a mask at all times, including in the patient's room. Patients must also wear a mask when staff or their visitor are in the room. Masking is required regardless of vaccination status.

## 2021-01-14 NOTE — Patient Instructions (Addendum)
Your procedure is scheduled on: Friday, September 16 Report to the Registration Desk on the 1st floor of the [facility] To find out your arrival time, please call [phone] between 1PM - 3PM on: Thursday, September 15  REMEMBER: Instructions that are not followed completely may result in serious medical risk, up to and including death; or upon the discretion of your surgeon and anesthesiologist your surgery may need to be rescheduled.  Do not eat food after midnight the night before surgery.  No gum chewing, lozengers or hard candies.  You may however, drink water up to 2 hours before you are scheduled to arrive for your surgery. Do not drink anything within 2 hours of your scheduled arrival time.  In addition, your doctor has ordered for you to drink the provided  Ensure Pre-Surgery Clear Carbohydrate Drink  Drinking this carbohydrate drink up to two hours before surgery helps to reduce insulin resistance and improve patient outcomes. Please complete drinking 2 hours prior to scheduled arrival time.  TAKE THESE MEDICATIONS THE MORNING OF SURGERY WITH A SIP OF WATER:  Metoprolol Oxycodone if needed for pain Pantroprazole (protonix) - (take one the night before and one on the morning of surgery - helps to prevent nausea after surgery.)  Stop metformin 2 days prior to surgery. Last day to take is Tuesday, September 20. Resume AFTER surgery.  Follow recommendations from Cardiologist, Pulmonologist or PCP regarding stopping Plavix. According to Dr. Irene Limbo note; stop plavix 2 days prior to surgery.   One week prior to surgery: Stop Anti-inflammatories (NSAIDS) such as Advil, Aleve, Ibuprofen, Motrin, Naproxen, Naprosyn and Aspirin based products such as Excedrin, Goodys Powder, BC Powder. Stop ANY OVER THE COUNTER supplements until after surgery. Stop iron, vitamin D You may however, continue to take Tylenol if needed for pain up until the day of surgery.  No Alcohol for 24 hours  before or after surgery.  On the morning of surgery brush your teeth with toothpaste and water, you may rinse your mouth with mouthwash if you wish. Do not swallow any toothpaste or mouthwash.  Use CHG wipes as directed on instruction sheet.  Do not wear jewelry, make-up, hairpins, clips or nail polish.  Do not wear lotions, powders, or perfumes.   Do not bring valuables to the hospital. [facility] is not responsible for any missing/lost belongings or valuables.   Notify your doctor if there is any change in your medical condition (cold, fever, infection).  Wear comfortable clothing (specific to your surgery type) to the hospital.  After surgery, you can help prevent lung complications by doing breathing exercises.  Take deep breaths and cough every 1-2 hours. Your doctor may order a device called an Incentive Spirometer to help you take deep breaths.  If you are being discharged the day of surgery, you will not be allowed to drive home. You will need a responsible adult (18 years or older) to drive you home and stay with you that night.   Please call the Pre-admissions Testing Dept. at [phone] if you have any questions about these instructions.  Surgery Visitation Policy:  Patients undergoing a surgery or procedure may have one family member or support person with them as long as that person is not COVID-19 positive or experiencing its symptoms.  That person may remain in the waiting area during the procedure.

## 2021-01-15 ENCOUNTER — Ambulatory Visit: Payer: Medicare Other

## 2021-01-15 ENCOUNTER — Other Ambulatory Visit: Payer: Self-pay

## 2021-01-15 ENCOUNTER — Ambulatory Visit
Admission: RE | Admit: 2021-01-15 | Discharge: 2021-01-15 | Disposition: A | Discharge status: Home / Self Care | Payer: Medicare Other | Attending: Podiatry | Admitting: Podiatry

## 2021-01-15 ENCOUNTER — Encounter: Payer: Self-pay | Admitting: Podiatry

## 2021-01-15 ENCOUNTER — Encounter
Admission: RE | Disposition: A | Discharge status: Home / Self Care | Payer: Self-pay | Source: Home / Self Care | Attending: Podiatry

## 2021-01-15 ENCOUNTER — Ambulatory Visit: Payer: Medicare Other | Admitting: Urgent Care

## 2021-01-15 ENCOUNTER — Ambulatory Visit: Payer: Medicare Other | Admitting: Certified Registered Nurse Anesthetist

## 2021-01-15 DIAGNOSIS — Z79899 Other long term (current) drug therapy: Secondary | ICD-10-CM | POA: Diagnosis not present

## 2021-01-15 DIAGNOSIS — Z7902 Long term (current) use of antithrombotics/antiplatelets: Secondary | ICD-10-CM | POA: Insufficient documentation

## 2021-01-15 DIAGNOSIS — Z7984 Long term (current) use of oral hypoglycemic drugs: Secondary | ICD-10-CM | POA: Diagnosis not present

## 2021-01-15 DIAGNOSIS — S82842A Displaced bimalleolar fracture of left lower leg, initial encounter for closed fracture: Secondary | ICD-10-CM | POA: Diagnosis present

## 2021-01-15 DIAGNOSIS — X501XXA Overexertion from prolonged static or awkward postures, initial encounter: Secondary | ICD-10-CM | POA: Diagnosis not present

## 2021-01-15 DIAGNOSIS — Z419 Encounter for procedure for purposes other than remedying health state, unspecified: Secondary | ICD-10-CM

## 2021-01-15 HISTORY — PX: ORIF ANKLE FRACTURE: SHX5408

## 2021-01-15 LAB — GLUCOSE, CAPILLARY
Glucose-Capillary: 148 mg/dL — ABNORMAL HIGH (ref 70–99)
Glucose-Capillary: 157 mg/dL — ABNORMAL HIGH (ref 70–99)

## 2021-01-15 SURGERY — OPEN REDUCTION INTERNAL FIXATION (ORIF) ANKLE FRACTURE
Anesthesia: General | Site: Ankle | Laterality: Left

## 2021-01-15 MED ORDER — PROPOFOL 10 MG/ML IV BOLUS
INTRAVENOUS | Status: DC | PRN
  Administered 2021-01-15: 150 mg via INTRAVENOUS

## 2021-01-15 MED ORDER — ACETAMINOPHEN 10 MG/ML IV SOLN
INTRAVENOUS | Status: DC | PRN
  Administered 2021-01-15: 1000 mg via INTRAVENOUS

## 2021-01-15 MED ORDER — BUPIVACAINE-EPINEPHRINE (PF) 0.25% -1:200000 IJ SOLN
INTRAMUSCULAR | Status: AC
  Filled 2021-01-15: qty 30

## 2021-01-15 MED ORDER — ONDANSETRON HCL 4 MG/2ML IJ SOLN
INTRAMUSCULAR | Status: AC
  Filled 2021-01-15: qty 2

## 2021-01-15 MED ORDER — ONDANSETRON HCL 4 MG/2ML IJ SOLN
INTRAMUSCULAR | Status: DC | PRN
  Administered 2021-01-15: 4 mg via INTRAVENOUS

## 2021-01-15 MED ORDER — BUPIVACAINE LIPOSOME 1.3 % IJ SUSP
INTRAMUSCULAR | Status: AC
  Filled 2021-01-15: qty 20

## 2021-01-15 MED ORDER — FENTANYL CITRATE (PF) 250 MCG/5ML IJ SOLN
INTRAMUSCULAR | Status: AC
  Filled 2021-01-15: qty 5

## 2021-01-15 MED ORDER — FENTANYL CITRATE (PF) 100 MCG/2ML IJ SOLN
INTRAMUSCULAR | Status: DC | PRN
  Administered 2021-01-15: 50 ug via INTRAVENOUS
  Administered 2021-01-15: 25 ug via INTRAVENOUS
  Administered 2021-01-15 (×2): 50 ug via INTRAVENOUS
  Administered 2021-01-15: 25 ug via INTRAVENOUS

## 2021-01-15 MED ORDER — DEXMEDETOMIDINE (PRECEDEX) IN NS 20 MCG/5ML (4 MCG/ML) IV SYRINGE
PREFILLED_SYRINGE | INTRAVENOUS | Status: DC | PRN
  Administered 2021-01-15 (×5): 4 ug via INTRAVENOUS

## 2021-01-15 MED ORDER — SODIUM CHLORIDE 0.9 % IV SOLN: INTRAVENOUS | Status: DC

## 2021-01-15 MED ORDER — CEFAZOLIN SODIUM-DEXTROSE 2-4 GM/100ML-% IV SOLN
2.0000 g | INTRAVENOUS | Status: AC
  Administered 2021-01-15: 2 g via INTRAVENOUS

## 2021-01-15 MED ORDER — ONDANSETRON HCL 4 MG/2ML IJ SOLN: 4.0000 mg | Freq: Four times a day (QID) | INTRAMUSCULAR | Status: DC | PRN

## 2021-01-15 MED ORDER — LIDOCAINE HCL (CARDIAC) PF 100 MG/5ML IV SOSY
PREFILLED_SYRINGE | INTRAVENOUS | Status: DC | PRN
  Administered 2021-01-15: 100 mg via INTRAVENOUS

## 2021-01-15 MED ORDER — DEXAMETHASONE SODIUM PHOSPHATE 10 MG/ML IJ SOLN
INTRAMUSCULAR | Status: DC | PRN
  Administered 2021-01-15: 5 mg via INTRAVENOUS

## 2021-01-15 MED ORDER — ONDANSETRON HCL 4 MG PO TABS: 4.0000 mg | ORAL_TABLET | Freq: Four times a day (QID) | ORAL | Status: DC | PRN

## 2021-01-15 MED ORDER — METOCLOPRAMIDE HCL 10 MG PO TABS: 5.0000 mg | ORAL_TABLET | Freq: Three times a day (TID) | ORAL | Status: DC | PRN

## 2021-01-15 MED ORDER — ORAL CARE MOUTH RINSE: 15.0000 mL | Freq: Once | OROMUCOSAL | Status: AC

## 2021-01-15 MED ORDER — CHLORHEXIDINE GLUCONATE 0.12 % MT SOLN: 15.0000 mL | Freq: Once | OROMUCOSAL | Status: AC

## 2021-01-15 MED ORDER — CEFAZOLIN SODIUM-DEXTROSE 2-4 GM/100ML-% IV SOLN
INTRAVENOUS | Status: AC
  Filled 2021-01-15: qty 100

## 2021-01-15 MED ORDER — DEXMEDETOMIDINE HCL IN NACL 200 MCG/50ML IV SOLN
INTRAVENOUS | Status: AC
  Filled 2021-01-15: qty 50

## 2021-01-15 MED ORDER — OXYCODONE-ACETAMINOPHEN 5-325 MG PO TABS: 1.0000 | ORAL_TABLET | Freq: Four times a day (QID) | ORAL | 0 refills | Status: AC | PRN

## 2021-01-15 MED ORDER — BUPIVACAINE LIPOSOME 1.3 % IJ SUSP
INTRAMUSCULAR | Status: DC | PRN
  Administered 2021-01-15: 20 mL
  Administered 2021-01-15: 25 mL

## 2021-01-15 MED ORDER — FENTANYL CITRATE (PF) 100 MCG/2ML IJ SOLN: 25.0000 ug | INTRAMUSCULAR | Status: DC | PRN

## 2021-01-15 MED ORDER — 0.9 % SODIUM CHLORIDE (POUR BTL) OPTIME
TOPICAL | Status: DC | PRN
  Administered 2021-01-15: 500 mL

## 2021-01-15 MED ORDER — ONDANSETRON HCL 4 MG/2ML IJ SOLN: 4.0000 mg | Freq: Once | INTRAMUSCULAR | Status: DC | PRN

## 2021-01-15 MED ORDER — LIDOCAINE HCL (PF) 2 % IJ SOLN
INTRAMUSCULAR | Status: AC
  Filled 2021-01-15: qty 5

## 2021-01-15 MED ORDER — ACETAMINOPHEN 10 MG/ML IV SOLN
INTRAVENOUS | Status: AC
  Filled 2021-01-15: qty 100

## 2021-01-15 MED ORDER — CHLORHEXIDINE GLUCONATE 0.12 % MT SOLN
OROMUCOSAL | Status: AC
  Administered 2021-01-15: 15 mL via OROMUCOSAL
  Filled 2021-01-15: qty 15

## 2021-01-15 MED ORDER — DEXAMETHASONE SODIUM PHOSPHATE 10 MG/ML IJ SOLN
INTRAMUSCULAR | Status: AC
  Filled 2021-01-15: qty 1

## 2021-01-15 MED ORDER — METOCLOPRAMIDE HCL 5 MG/ML IJ SOLN: 5.0000 mg | Freq: Three times a day (TID) | INTRAMUSCULAR | Status: DC | PRN

## 2021-01-15 MED ORDER — POVIDONE-IODINE 7.5 % EX SOLN
Freq: Once | CUTANEOUS | Status: DC
  Filled 2021-01-15: qty 118

## 2021-01-15 SURGICAL SUPPLY — 53 items
BIT DRILL 2.9 CANN QC NONSTRL (BIT) ×1 IMPLANT
BIT DRILL CALIBRATED 2.7 (BIT) ×1 IMPLANT
BNDG CMPR STD VLCR NS LF 5.8X4 (GAUZE/BANDAGES/DRESSINGS) ×1
BNDG COHESIVE 4X5 TAN ST LF (GAUZE/BANDAGES/DRESSINGS) ×2 IMPLANT
BNDG CONFORM 2 STRL LF (GAUZE/BANDAGES/DRESSINGS) ×2 IMPLANT
BNDG CONFORM 3 STRL LF (GAUZE/BANDAGES/DRESSINGS) ×2 IMPLANT
BNDG ELASTIC 4X5.8 VLCR NS LF (GAUZE/BANDAGES/DRESSINGS) ×2 IMPLANT
BNDG ESMARK 4X12 TAN STRL LF (GAUZE/BANDAGES/DRESSINGS) ×2 IMPLANT
BNDG GAUZE ELAST 4 BULKY (GAUZE/BANDAGES/DRESSINGS) ×2 IMPLANT
DRAPE C-ARM XRAY 36X54 (DRAPES) ×2 IMPLANT
DRAPE C-ARMOR (DRAPES) ×2 IMPLANT
DURAPREP 26ML APPLICATOR (WOUND CARE) ×2 IMPLANT
ELECT REM PT RETURN 9FT ADLT (ELECTROSURGICAL) ×2
ELECTRODE REM PT RTRN 9FT ADLT (ELECTROSURGICAL) ×1 IMPLANT
GAUZE 4X4 16PLY ~~LOC~~+RFID DBL (SPONGE) ×2 IMPLANT
GAUZE SPONGE 4X4 12PLY STRL (GAUZE/BANDAGES/DRESSINGS) ×2 IMPLANT
GAUZE XEROFORM 1X8 LF (GAUZE/BANDAGES/DRESSINGS) ×2 IMPLANT
GLOVE SURG ENC MOIS LTX SZ7.5 (GLOVE) ×2 IMPLANT
GLOVE SURG UNDER LTX SZ8 (GLOVE) ×2 IMPLANT
GOWN STRL REUS W/ TWL XL LVL3 (GOWN DISPOSABLE) ×3 IMPLANT
GOWN STRL REUS W/TWL XL LVL3 (GOWN DISPOSABLE) ×6
K-WIRE ACE 1.6X6 (WIRE) ×6
KIT TURNOVER KIT A (KITS) ×2 IMPLANT
KWIRE ACE 1.6X6 (WIRE) IMPLANT
LABEL OR SOLS (LABEL) ×2 IMPLANT
MANIFOLD NEPTUNE II (INSTRUMENTS) ×2 IMPLANT
NEEDLE HYPO 22GX1.5 SAFETY (NEEDLE) ×2 IMPLANT
NS IRRIG 500ML POUR BTL (IV SOLUTION) ×2 IMPLANT
PACK EXTREMITY ARMC (MISCELLANEOUS) ×2 IMPLANT
PAD PREP 24X41 OB/GYN DISP (PERSONAL CARE ITEMS) ×2 IMPLANT
PLATE LOCK 6H 77 BILAT FIB (Plate) ×1 IMPLANT
SCREW ACE CAN 4.0 38M (Screw) ×1 IMPLANT
SCREW ACE CAN 4.0 42M (Screw) ×1 IMPLANT
SCREW ACE CAN 4.0 55M (Screw) ×1 IMPLANT
SCREW LOCK CORT STAR 3.5X10 (Screw) ×4 IMPLANT
SCREW LOCK CORT STAR 3.5X14 (Screw) ×1 IMPLANT
SPLINT CAST 1 STEP 4X30 (MISCELLANEOUS) ×2 IMPLANT
SPLINT FAST PLASTER 5X30 (CAST SUPPLIES) ×1
SPLINT PLASTER CAST FAST 5X30 (CAST SUPPLIES) ×1 IMPLANT
SPONGE T-LAP 18X18 ~~LOC~~+RFID (SPONGE) ×2 IMPLANT
STAPLER SKIN PROX 35W (STAPLE) ×2 IMPLANT
STOCKINETTE M/LG 89821 (MISCELLANEOUS) ×2 IMPLANT
STRAP SAFETY 5IN WIDE (MISCELLANEOUS) ×2 IMPLANT
SUT ETH BLK MONO 3 0 FS 1 12/B (SUTURE) ×1 IMPLANT
SUT ETHILON 3-0 FS-10 30 BLK (SUTURE) ×2
SUT VIC AB 3-0 SH 27 (SUTURE) ×4
SUT VIC AB 3-0 SH 27X BRD (SUTURE) ×1 IMPLANT
SUTURE EHLN 3-0 FS-10 30 BLK (SUTURE) IMPLANT
SYR 10ML LL (SYRINGE) ×2 IMPLANT
SYR 50ML LL SCALE MARK (SYRINGE) ×2 IMPLANT
WASHER FLAT ACE (Orthopedic Implant) ×2 IMPLANT
WASHER PLAIN FLAT ACE NS 3PK (Orthopedic Implant) IMPLANT
WATER STERILE IRR 500ML POUR (IV SOLUTION) ×2 IMPLANT

## 2021-01-15 NOTE — Transfer of Care (Signed)
Immediate Anesthesia Transfer of Care Note  Patient: [name]  Procedure(s) Performed: OPEN REDUCTION INTERNAL FIXATION (ORIF) ANKLE FRACTURE (Left: Ankle)  Patient Location: PACU  Anesthesia Type:General  Level of Consciousness: drowsy  Airway & Oxygen Therapy: Patient Spontanous Breathing and Patient connected to nasal cannula oxygen  Post-op Assessment: Report given to RN and Post -op Vital signs reviewed and stable  Post vital signs: Reviewed and stable  Last Vitals:  Vitals Value Taken Time  BP 129/81 01/15/21 1656  Temp    Pulse 81 01/15/21 1659  Resp 17 01/15/21 1659  SpO2 99 % 01/15/21 1659  Vitals shown include unvalidated device data.  Last Pain:  Vitals:   01/15/21 1403  TempSrc: Oral  PainSc: 4          Complications: No notable events documented.

## 2021-01-15 NOTE — H&P (Signed)
HISTORY AND PHYSICAL INTERVAL NOTE:  01/15/2021  2:39 PM  [name]  has presented today for surgery, with the diagnosis of S82.845A- Closed nondisplaced bimalleolar fracture of left ankle.  The various methods of treatment have been discussed with the patient.  No guarantees were given.  After consideration of risks, benefits and other options for treatment, the patient has consented to surgery.  I have reviewed the patients' chart and labs.     A history and physical examination was performed in my office.  The patient was reexamined.  There have been no changes to this history and physical examination.  [name] A

## 2021-01-15 NOTE — Telephone Encounter (Signed)
   Name: [name]  DOB: November 01, 1950  MRN: [id number]   Primary Cardiologist: Bryan Lemma, MD  Chart reviewed as part of pre-operative protocol coverage.  He has history of CAD s/p LAD PCI/DES 2005 with most recent catheterization in 2014 showing patent stent and 2014 echo EF 55 to 60%.  He has history of syncope in 2014 s/p loop recorder s/p explantation.  He has had PSVT.  He has DM2 not on insulin, hyperlipidemia, hypertension, and GERD.    Patient was contacted 01/15/2021 in reference to pre-operative risk assessment for pending surgery as outlined below.  [name] was last seen on 07/01/2020 by Dr. Herbie Baltimore.  Since that day, [name] has done well without any chest pain or shortness of breath.  He reports that he is able to be active without any exertional symptoms.  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  Calculated RCRI low risk with 0.9% risk of MACE.  Therefore, based on ACC/AHA guidelines, the patient would be at acceptable risk for the planned procedure without further cardiovascular testing.   We reviewed the recommendations to hold Plavix 5 days prior to his procedure with restart as recommended by his surgeon and as soon as felt safe to do so from a bleeding standpoint.  The patient was advised that if he develops new symptoms prior to surgery to contact our office to arrange for a follow-up visit, and he verbalized understanding.  I will route this recommendation to the requesting party via Epic fax function and remove from pre-op pool. Please call with questions.  Lennon Alstrom, PA-C 01/15/2021, 12:00 PM

## 2021-01-15 NOTE — Discharge Instructions (Addendum)
[facility]  POST OPERATIVE INSTRUCTIONS FOR DR. FOWLER AND [facility] PODIATRY [facility]PART[facility]   Take your medication as prescribed.  Pain medication should be taken only as needed.  Keep the dressing clean, dry and intact.  Keep your foot elevated above the heart level for the first 48 hours.  We have instructed you to be non-weight bearing.  Always wear your post-op shoe when walking.  Always use your crutches if you are to be non-weight bearing.  Do not take a shower. Baths are permissible as long as the foot is kept out of the water.   Every hour you are awake:  Bend your knee 15 times.  Call [facility] ([phone]) if any of the following problems occur: You develop a temperature or fever. The bandage becomes saturated with blood. Medication does not stop your pain. Injury of the foot occurs. Any symptoms of infection including redness, odor, or red streaks running from wound.  AMBULATORY SURGERY  DISCHARGE INSTRUCTIONS   The drugs that you were given will stay in your system until tomorrow so for the next 24 hours you should not:  Drive an automobile Make any legal decisions Drink any alcoholic beverage   You may resume regular meals tomorrow.  Today it is better to start with liquids and gradually work up to solid foods.  You may eat anything you prefer, but it is better to start with liquids, then soup and crackers, and gradually work up to solid foods.   Please notify your doctor immediately if you have any unusual bleeding, trouble breathing, redness and pain at the surgery site, drainage, fever, or pain not relieved by medication.    Additional Instructions:  Please contact your physician with any problems or Same Day Surgery at [phone], Monday through Friday 6 am to 4 pm, or [place] [facility] [place] [facility] number at [phone]. 

## 2021-01-15 NOTE — Anesthesia Procedure Notes (Signed)
Procedure Name: LMA Insertion Date/Time: 01/15/2021 3:10 PM Performed by: Hezzie Bump, CRNA Pre-anesthesia Checklist: Patient identified, Patient being monitored, Timeout performed, Emergency Drugs available and Suction available Patient Re-evaluated:Patient Re-evaluated prior to induction Oxygen Delivery Method: Circle system utilized Preoxygenation: Pre-oxygenation with 100% oxygen Induction Type: IV induction Ventilation: Mask ventilation without difficulty LMA: LMA inserted LMA Size: 4.5 Tube type: Oral Number of attempts: 1 Placement Confirmation: positive ETCO2 and breath sounds checked- equal and bilateral Tube secured with: Tape Dental Injury: Teeth and Oropharynx as per pre-operative assessment

## 2021-01-15 NOTE — Op Note (Signed)
Operative note   Surgeon:Qamar Rosman [employer] forces logistics/support/administrative officer: None    Preop diagnosis: Bimalleolar left ankle fracture    Postop diagnosis: Same    Procedure: Open reduction with internal fixation bimalleolar left ankle fracture    EBL: Minimal    Anesthesia:local and general    Hemostasis: Thigh tourniquet inflated to 250 mmHg for approximately 80 minutes    Specimen: None    Complications: None    Operative indications:Bassem A Raineri is an 70 y.o. that presents today for surgical intervention.  The risks/benefits/alternatives/complications have been discussed and consent has been given.    Procedure:  Patient was brought into the OR and placed on the operating table in thesupine position. After anesthesia was obtained theleft lower extremity was prepped and draped in usual sterile fashion.  Attention was directed to the lateral aspect of the left ankle where longitudinal incision performed overlying the fibula.  Sharp and blunt dissection carried down to the fibula.  This time the fibular fracture was noted.  This was a transverse fracture at the level of the ankle.  Minimal dissection was needed and minimal reduction was performed.  I was able to get it back into a good anatomically aligned position.  At this time a 6 hole composite Biomet fibular plate was placed laterally.  2 holes distal to the fracture and 3 holes proximal to the fracture were all filled with locking screws.  Attention was then directed to the medial malleoli region where a medial incision was performed.  This was taken from the metaphyseal area to the medial malleoli region distally.  Sharp and blunt dissection carried down to the periosteum.  At this time the fracture site was noted.  The fracture was cleared of soft tissue interposed.  It was then reduced with a large bone reduction clamp.  This was a mostly transverse type of fracture with slight angulation.  At this time 2 screws were placed from medial to lateral  almost horizontal to the tibiotalar joint.  Screw required a washer due to the bone.  Second screw was stable.  These were 4.0 cannulated screws.  A third screw was driven from the distal medial malleolus across the fracture site into the body of the tibia.  This was also 4 oh cannulated screw.  Good stability and anatomic alignment was noted postoperatively.  All wounds were flushed with copious amounts of irrigation.  Layered closure was then performed with 3-0 Vicryl for the deeper layers and 3-0 nylon for the skin.  Patient was then placed in a posterior splint after a bulky sterile dressing was applied.    Patient tolerated the procedure and anesthesia well.  Was transported from the OR to the PACU with all vital signs stable and vascular status intact. To be discharged per routine protocol.  Will follow up in approximately 1 week in the outpatient clinic.

## 2021-01-15 NOTE — Anesthesia Preprocedure Evaluation (Signed)
Anesthesia Evaluation  Patient identified by MRN, date of birth, ID band Patient awake    Reviewed: Allergy & Precautions, H&P , NPO status , Patient's Chart, lab work & pertinent test results, reviewed documented beta blocker date and time   Airway Mallampati: II  TM Distance: >3 FB Neck ROM: full    Dental  (+) Teeth Intact   Pulmonary neg pulmonary ROS,    Pulmonary exam normal        Cardiovascular Exercise Tolerance: Good hypertension, On Medications + CAD  Normal cardiovascular exam Rate:Normal     Neuro/Psych negative neurological ROS  negative psych ROS   GI/Hepatic negative GI ROS, Neg liver ROS,   Endo/Other  negative endocrine ROSdiabetes  Renal/GU negative Renal ROS  negative genitourinary   Musculoskeletal   Abdominal   Peds  Hematology negative hematology ROS (+)   Anesthesia Other Findings   Reproductive/Obstetrics negative OB ROS                             Anesthesia Physical Anesthesia Plan  ASA: 2  Anesthesia Plan: General LMA   Post-op Pain Management:    Induction:   PONV Risk Score and Plan:   Airway Management Planned:   Additional Equipment:   Intra-op Plan:   Post-operative Plan:   Informed Consent: I have reviewed the patients History and Physical, chart, labs and discussed the procedure including the risks, benefits and alternatives for the proposed anesthesia with the patient or authorized representative who has indicated his/her understanding and acceptance.       Plan Discussed with: CRNA  Anesthesia Plan Comments:         Anesthesia Quick Evaluation

## 2021-01-16 ENCOUNTER — Encounter: Payer: Self-pay | Admitting: Podiatry

## 2021-01-17 NOTE — Anesthesia Postprocedure Evaluation (Addendum)
Anesthesia Post Note  Patient: CLARENCE COGSWELL  Procedure(s) Performed: OPEN REDUCTION INTERNAL FIXATION (ORIF) ANKLE FRACTURE (Left: Ankle)  Patient location during evaluation: PACU Anesthesia Type: General Level of consciousness: awake and alert Pain management: pain level controlled Vital Signs Assessment: post-procedure vital signs reviewed and stable Respiratory status: spontaneous breathing, nonlabored ventilation, respiratory function stable and patient connected to nasal cannula oxygen Cardiovascular status: blood pressure returned to baseline and stable Postop Assessment: no apparent nausea or vomiting Anesthetic complications: no   No notable events documented.   Last Vitals:  Vitals:   01/15/21 1730 01/15/21 1738  BP: 124/86 137/86  Pulse: 80 84  Resp: 12 18  Temp: 36.4 C   SpO2: 97% 97%    Last Pain:  Vitals:   01/16/21 1438  TempSrc:   PainSc: 4                  Lenard Simmer

## 2021-07-12 ENCOUNTER — Telehealth: Payer: Self-pay

## 2021-07-12 NOTE — Telephone Encounter (Signed)
It is fine for him to hold his Plavix 5 days preop for surgery or procedure. ? ?He should be fine to proceed with his colonoscopy - does not need to be seen prior to this. ? ?Bryan Lemma, MD ? ?

## 2021-07-12 NOTE — Telephone Encounter (Signed)
? ?  Chief Complaint  ?Patient presents with  ? Results  ?  Labs from PCP dated 07/02/2021 (From the [facility])  ? ? ?Na+ 139, K+ 4.5, Cl- 101, HCO3-25, BUN 21, Cr 1.05, Glu 133, Ca2+ 9.3; AST 14, ALT 22, AlkP 99 ? ?TC 100, TG 87, HDL 38, LDL 45; A1c 7.6 ? ?Labs look good.  A1c not quite at goal, defer to PCP.  Otherwise looks good. ? ? ?Bryan Lemma, MD ? ? ?

## 2021-07-12 NOTE — Telephone Encounter (Signed)
Patient was last seen 1 year ago and needs another follow up for clearance. He is currently scheduled to see Dr. Herbie Baltimore in May. Cardiac clearance can be addressed at that time by MD.  ?

## 2021-07-12 NOTE — Telephone Encounter (Signed)
? ?  Pre-operative Risk Assessment  ?  ?Patient Name: [name]  ?DOB: 1950-07-04 ?MRN: [id number]  ? ?  ? ?Request for Surgical Clearance   ? ?Procedure:   colonoscopy ? ?Date of Surgery:  Clearance 10/13/21                              ?   ?Surgeon:  Dr. Matthias Hughs ?Surgeon's Group or Practice Name:  [facility] ?Phone number:  [phone] ?Fax number:  [phone] ?  ?Type of Clearance Requested:   ?Pharmacy: Plavix to hold for 5 days? ?  ?Type of Anesthesia:   propofol ?  ?Additional requests/questions:   ? ?Signed, ?Melina Modena   ?07/12/2021, 5:11 PM  ? ?

## 2021-07-12 NOTE — Telephone Encounter (Signed)
Pt has appt 09/21/21 with Dr. Ellyn Hack. See notes from pre op provider Almyra Deforest, [facility] today. I will send FYI to requesting office the pt hs appt  ?

## 2021-07-13 NOTE — Telephone Encounter (Signed)
? ? ?  Patient Name: [name]  ?DOB: 1950/07/20 ?MRN: [id number] ? ?Primary Cardiologist: Bryan Lemma, MD ? ?Chart reviewed as part of pre-operative protocol coverage. Given past medical history and time since last visit, based on ACC/AHA guidelines, TYMON NEMETZ would be at acceptable risk for the planned procedure without further cardiovascular testing.  ? ?Patient may hold plavix for 5 days prior to the procedure and restart as soon as possible afterward at the surgeon's discretion. ? ?The patient was advised that if he develops new symptoms prior to surgery to contact our office to arrange for a follow-up visit, and he verbalized understanding. ? ?I will route this recommendation to the requesting party via Epic fax function and remove from pre-op pool. ? ?Please call with questions. ? ?Azalee Course, [place] ?07/13/2021, 8:52 AM  ?

## 2021-08-12 ENCOUNTER — Other Ambulatory Visit: Payer: Self-pay | Admitting: Cardiology

## 2021-09-21 ENCOUNTER — Ambulatory Visit (INDEPENDENT_AMBULATORY_CARE_PROVIDER_SITE_OTHER): Payer: Medicare Other | Admitting: Cardiology

## 2021-09-21 ENCOUNTER — Encounter: Payer: Self-pay | Admitting: Cardiology

## 2021-09-21 VITALS — BP 124/72 | HR 70 | Ht 68.0 in | Wt 172.2 lb

## 2021-09-21 DIAGNOSIS — I2510 Atherosclerotic heart disease of native coronary artery without angina pectoris: Secondary | ICD-10-CM | POA: Diagnosis not present

## 2021-09-21 DIAGNOSIS — Z8679 Personal history of other diseases of the circulatory system: Secondary | ICD-10-CM

## 2021-09-21 DIAGNOSIS — E1169 Type 2 diabetes mellitus with other specified complication: Secondary | ICD-10-CM

## 2021-09-21 DIAGNOSIS — E119 Type 2 diabetes mellitus without complications: Secondary | ICD-10-CM

## 2021-09-21 DIAGNOSIS — Z9861 Coronary angioplasty status: Secondary | ICD-10-CM

## 2021-09-21 DIAGNOSIS — I10 Essential (primary) hypertension: Secondary | ICD-10-CM

## 2021-09-21 DIAGNOSIS — R55 Syncope and collapse: Secondary | ICD-10-CM

## 2021-09-21 DIAGNOSIS — E785 Hyperlipidemia, unspecified: Secondary | ICD-10-CM

## 2021-09-21 MED ORDER — NITROGLYCERIN 0.4 MG SL SUBL: 0.4000 mg | SUBLINGUAL_TABLET | SUBLINGUAL | 6 refills | Status: DC | PRN

## 2021-09-21 NOTE — Patient Instructions (Signed)
Medication Instructions:  No changes  *If you need a refill on your cardiac medications before your next appointment, please call your pharmacy*   Lab Work: Not needed    Testing/Procedures: Not needed   Follow-Up: At [facility], you and your health needs are our priority.  As part of our continuing mission to provide you with exceptional heart care, we have created designated Provider Care Teams.  These Care Teams include your primary Cardiologist (physician) and Advanced Practice Providers (APPs -  Physician Assistants and Nurse Practitioners) who all work together to provide you with the care you need, when you need it.  We recommend signing up for the patient portal called "MyChart".  Sign up information is provided on this After Visit Summary.  MyChart is used to connect with patients for Virtual Visits (Telemedicine).  Patients are able to view lab/test results, encounter notes, upcoming appointments, etc.  Non-urgent messages can be sent to your provider as well.   To learn more about what you can do with MyChart, go to [email or web address].    Your next appointment:   12 month(s)  The format for your next appointment:   In Person  Provider:   Bryan Lemma, MD       Important Information About Sugar

## 2021-09-21 NOTE — Progress Notes (Signed)
Primary Care Provider: [facility] Cardiologist: Bryan Lemma, MD Electrophysiologist: None  Clinic Note: Chief Complaint  Patient presents with   Follow-up    Delayed annual follow-up, essentially 15 months   ===================================  ASSESSMENT/PLAN   Problem List Items Addressed This Visit       Cardiology Problems   CAD S/P percutaneous coronary angioplasty - Primary (Chronic)    Distant history of PCI to the LAD.  Maintains on maintenance dose of clopidogrel.  Is also on stable dose of beta-blocker ACE inhibitor statin.  Doing well.  No further angina.  Plan: Continue current meds. Okay to hold Plavix 5 to 7 days preop for surgeries or procedures.      Relevant Medications   nitroGLYCERIN (NITROSTAT) 0.4 MG SL tablet   Other Relevant Orders   EKG 12-Lead (Completed)   Essential hypertension (Chronic)    BP looks good on current dose of Toprol and ramipril.      Relevant Medications   nitroGLYCERIN (NITROSTAT) 0.4 MG SL tablet   Hyperlipidemia associated with type 2 diabetes mellitus (HCC) (Chronic)    LDL is well controlled at 45 on atorvastatin 80 mg.  Tolerating relatively well with no myalgias or arthralgias.  Could consider reducing atorvastatin to 40 mg if he does have some myalgias.  For diabetes he is only on metformin.  A1c is 7.8.  Strongly consider SGLT2 inhibitor or GLP agonist both of which also have cardiovascular benefit.  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  Just occasional flip-flopping palpitations every now and then.  Much better with having switched to Toprol to nighttime.      Relevant Medications   nitroGLYCERIN (NITROSTAT) 0.4 MG SL tablet     Other   Non-insulin dependent type 2 diabetes mellitus (HCC)  (Chronic)    As noted, inadequate control.  Probably needs more than metformin.  We will try to consider SGLT2 inhibitor or GLP-1 agonist.  Will defer to PCP and which was a better option, but need to try to shoot for LDL less than 7 initially and then less than 6 finally.      History of PSVT (paroxysmal supraventricular tachycardia) (Chronic)    As far as I can tell, no breakthrough episodes on current dose of Toprol.  No change.      Relevant Orders   EKG 12-Lead (Completed)    ===================================  HPI:    [name] is a 71 y.o. male with a PMH notable for CAD-PCI LAD 2005, history of syncope PSVT, HTN, HLD who presents today for annual at the request of [facility] [employer]  [name] was last seen on 07/01/2020 for annual follow-up doing well.  Very active on the farm.  Noted a little bit of dyspnea going uphill fast.  Otherwise doing fairly well.  No chest pain or pressure..  Off-and-on palpitations lasting a few seconds.  No major issues.  Nothing prolonged like PSVT.  Recent Hospitalizations:  01/15/2021: ORIF of left ankle fracture - got knocked over by a cow.   Reviewed  CV studies:    The following studies were reviewed today: (if available, images/films reviewed: From Epic Chart or Care Everywhere) None:  Interval History:   [name] returns here  today for delayed annual follow-up is still doing pretty well.  His follow-up was delayed because he tried to reschedule during the time that he had his surgery done last fall and that did not reschedule in time.  He subsequently was not scheduled till now.  Thankfully he is doing pretty well he does work on his farm he really only notes that his heart rate will go up if he really does a lot of vigorous activity or going up stairs/hills.   Not that often palpitations pretty much controlled with beta-blocker.  He says better since he started taking his Toprol at night.  Things to be better  controlled.  Now that he is finally recovered from his ankle surgery he is been more active and walking and not noted any chest pain or pressure with rest or exertion.  CV Review of Symptoms (Summary) Cardiovascular ROS: no chest pain or dyspnea on exertion positive for - palpitations and these are pretty rare.  Only notes his heart rate going up when he should i.e. with exertion.  If he gets up too fast he may get short of breath. negative for - edema, irregular heartbeat, orthopnea, paroxysmal nocturnal dyspnea, shortness of breath, or lightheadedness, dizziness or wooziness, syncope/near syncope or TIA/amaurosis fugax, claudication  REVIEWED OF SYSTEMS   Review of Systems  Constitutional:  Negative for malaise/fatigue and weight loss.  HENT:  Negative for congestion and nosebleeds.   Respiratory:  Negative for cough and shortness of breath.   Gastrointestinal:  Positive for constipation (Off-and-on constipation but no real loose stools in between.). Negative for blood in stool.  Genitourinary:  Negative for hematuria.  Musculoskeletal:  Positive for joint pain (Ankle seems to be healing well.). Negative for falls and myalgias.  Neurological:  Positive for tingling (Still has some numbness and tingling in his leg but also has hand some.).  Psychiatric/Behavioral: Negative.     Off-and-on constipation; numbness   I have reviewed and (if needed) personally updated the patient's problem list, medications, allergies, past medical and surgical history, social and family history.   PAST MEDICAL HISTORY   Past Medical History:  Diagnosis Date   Arthritis    CAD S/P percutaneous coronary angioplasty 05/03/2003   s/p PCI of LAD 3.0x34mm Promus DES; ReCath in 07/2010 & 12/2012: Widely patent LAD stent with minimal disease elsewhere, Normal EF ; negative Myoview in January 2014   Diabetes mellitus without complication (HCC)    type 2   Dyslipidemia    Essential hypertension    GERD  (gastroesophageal reflux disease)    History of PSVT (paroxysmal supraventricular tachycardia)    History of syncope 05/02/2012   s/p Loop Recorder - no positive etiology noted.   Right clavicle fracture    Seizures (HCC)    in teenage year's    PAST SURGICAL HISTORY   Past Surgical History:  Procedure Laterality Date   CARDIAC CATHETERIZATION  2005   noncritical CAD, aneursym in prox LAD, normal LV systolic function   CARDIAC CATHETERIZATION  2/28//2012   3.0x22mm Promus DES to LAD (repeat cath 07/2010 by Dr. Julieanne Manson showed patency of stent)   CARDIAC CATHETERIZATION  12/2012   widely patent LAD stent; minimal CAD elsewhere; patulous LAD   COLONOSCOPY W/ BIOPSIES AND POLYPECTOMY     HERNIA REPAIR  2005   LEFT HEART CATHETERIZATION WITH CORONARY ANGIOGRAM N/A 01/24/2013   Marykay Lex, MD;  Location: [facility] CATH LAB;  Service: Cardiovascular;: Widely patent LAD stent, minmal CAD elsewhere,  normal EF   LOOP RECORDER EXPLANT N/A 03/25/2013   Procedure: LOOP RECORDER EXPLANT;  Surgeon: Thurmon FairMihai Croitoru, MD;  Location: [facility] CATH LAB;  Service: Cardiovascular;  Laterality: N/A;   LOOP RECORDER IMPLANT  05/2012   Medtronic Reveal XT - placed for near-syncopal episodes by Dr. Judie PetitM. Croitoru   LOOP RECORDER IMPLANT N/A 05/11/2012   Procedure: LOOP RECORDER IMPLANT;  Surgeon: Thurmon FairMihai Croitoru, MD;  Location: [facility] CATH LAB;  Service: Cardiovascular;  Laterality: N/A;   NM MYOVIEW LTD  05/2012   Normal Stress Test - No ischemia or Infarction, EF 62%   ORIF ANKLE FRACTURE Left 01/15/2021   Procedure: OPEN REDUCTION INTERNAL FIXATION (ORIF) ANKLE FRACTURE;  Surgeon: Gwyneth RevelsFowler, Justin, DPM;  Location: [facility] ORS;  Service: Podiatry;  Laterality: Left;   ORIF CLAVICULAR FRACTURE Right 01/10/2014   Procedure: OPEN REDUCTION INTERNAL FIXATION (ORIF) RIGHT  CLAVICULAR FRACTURE;  Surgeon: Verlee RossettiSteven R Norris, MD;  [facility];  Service: Orthopedics;  Laterality: Right;   TRANSTHORACIC ECHOCARDIOGRAM  05/2012   EF  55-60%, normal wall motion, no valvular abnormalities - ordered for syncope    There is no immunization history on file for this patient.  MEDICATIONS/ALLERGIES   Current Meds  Medication Sig   clopidogrel (PLAVIX) 75 MG tablet TAKE 1 TABLET (75 MG TOTAL) BY MOUTH EVERY MORNING.   Cyanocobalamin (QC VITAMIN B12 PO) Take 1 tablet by mouth daily.   IRON PO Take 1 tablet by mouth 3 (three) times a week.   metFORMIN (GLUCOPHAGE-XR) 500 MG 24 hr tablet Take 500 mg by mouth 2 (two) times daily.   oxyCODONE-acetaminophen (PERCOCET) 5-325 MG tablet Take 1 tablet by mouth every 4 (four) hours as needed for severe pain.   oxyCODONE-acetaminophen (PERCOCET) 5-325 MG tablet Take 1-2 tablets by mouth every 6 (six) hours as needed for severe pain. Max 6 tabs per day   pantoprazole (PROTONIX) 40 MG tablet Take 40 mg by mouth daily.   ramipril (ALTACE) 5 MG capsule TAKE 1 CAPSULE BY MOUTH EVERY DAY   VITAMIN D, CHOLECALCIFEROL, PO Take 1 tablet by mouth daily.   [DISCONTINUED] atorvastatin (LIPITOR) 80 MG tablet TAKE 1 TABLET BY MOUTH EVERY DAY (Patient taking differently: Take 80 mg by mouth at bedtime.)   [DISCONTINUED] metoprolol succinate (TOPROL-XL) 25 MG 24 hr tablet TAKE 1 TABLET BY MOUTH EVERY DAY   [DISCONTINUED] nitroGLYCERIN (NITROSTAT) 0.4 MG SL tablet Place 1 tablet (0.4 mg total) under the tongue every 5 (five) minutes as needed. For chest pain    No Known Allergies  SOCIAL HISTORY/FAMILY HISTORY   Reviewed in Epic:  Pertinent findings:  Social History   Tobacco Use   Smoking status: Never   Smokeless tobacco: Never  Substance Use Topics   Alcohol use: No   Drug use: No   Social History   Social History Narrative    He is a married father of 2, grandfather of 4. . 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  He is very active around the farm.      No alcohol.  Nonsmoker    OBJCTIVE -PE, EKG, labs   Wt Readings from Last 3 Encounters:  09/21/21 172 lb 3.2 oz (78.1 kg)  01/15/21 172 lb (78 kg)  01/14/21 175 lb (79.4 kg)    Physical Exam: BP 124/72   Pulse 70   Ht 5'  8" (1.727 m)   Wt 172 lb 3.2 oz (78.1 kg)   SpO2 99%   BMI 26.18 kg/m  Physical Exam Vitals reviewed.  Constitutional:      General: He is not in acute distress.    Appearance: Normal appearance. He is normal weight. He is not toxic-appearing.  HENT:     Head: Normocephalic and atraumatic.  Neck:     Vascular: No carotid bruit or JVD.  Cardiovascular:     Rate and Rhythm: Normal rate and regular rhythm. No extrasystoles are present.    Chest Wall: PMI is not displaced.     Pulses: Normal pulses.     Heart sounds: Normal heart sounds, S1 normal and S2 normal. No murmur heard.    No friction rub. No gallop.  Pulmonary:     Effort: Pulmonary effort is normal. No respiratory distress.     Breath sounds: Normal breath sounds. No wheezing, rhonchi or rales.  Musculoskeletal:        General: No swelling. Normal range of motion.     Cervical back: Normal range of motion and neck supple.  Skin:    General: Skin is warm.     Coloration: Skin is not jaundiced.  Neurological:     General: No focal deficit present.     Mental Status: He is alert and oriented to person, place, and time.     Gait: Gait normal.  Psychiatric:        Mood and Affect: Mood normal.        Behavior: Behavior normal.        Thought Content: Thought content normal.        Judgment: Judgment normal.     Adult ECG Report  Rate: 70 ;  Rhythm: normal sinus rhythm and LAFB (-67), LVH ;   Narrative Interpretation: STABLE   Recent Labs:  07/02/2021:    Lab Results  Component Value Date   CHOL 98 (L) 12/19/2018   HDL 37 (L) 12/19/2018   LDLCALC 44 12/19/2018   TRIG 83 12/19/2018   CHOLHDL 2.6 12/19/2018   Lab Results  Component Value Date   CREATININE 0.96 01/14/2021   BUN 22 01/14/2021    NA 135 01/14/2021   K 4.2 01/14/2021   CL 102 01/14/2021   CO2 25 01/14/2021      Latest Ref Rng & Units 01/14/2021   12:09 PM 01/10/2014    1:16 PM 01/24/2013    7:05 AM  CBC  WBC 4.0 - 10.5 K/uL 7.5  7.0  5.1   Hemoglobin 13.0 - 17.0 g/dL 56.8  12.7  51.7   Hematocrit 39.0 - 52.0 % 38.7  44.5  41.7   Platelets 150 - 400 K/uL 208  198  199     Lab Results  Component Value Date   HGBA1C 7.8 (H) 07/11/2017   Lab Results  Component Value Date   TSH 1.070 07/11/2017    ==================================================  COVID-19 Education: The signs and symptoms of COVID-19 were discussed with the patient and how to seek care for testing (follow up with PCP or arrange E-visit).    I spent a total of 14 minutes with the patient spent in direct patient consultation.  Additional time spent with chart review  / charting (studies, outside notes, etc): 18 min Total Time: 32 min  Current medicines are reviewed at length with the patient today.  (+/- concerns) N/A  This visit occurred during the SARS-CoV-2 public health emergency.  Safety protocols  were in place, including screening questions prior to the visit, additional usage of staff PPE, and extensive cleaning of exam room while observing appropriate contact time as indicated for disinfecting solutions.  Notice: This dictation was prepared with Dragon dictation along with smart phrase technology. Any transcriptional errors that result from this process are unintentional and may not be corrected upon review.  Studies Ordered:   Orders Placed This Encounter  Procedures   EKG 12-Lead   Meds ordered this encounter  Medications   nitroGLYCERIN (NITROSTAT) 0.4 MG SL tablet    Sig: Place 1 tablet (0.4 mg total) under the tongue every 5 (five) minutes as needed. For chest pain    Dispense:  25 tablet    Refill:  6    Patient Instructions / Medication Changes & Studies & Tests Ordered   Patient Instructions  Medication  Instructions:  No changes  *If you need a refill on your cardiac medications before your next appointment, please call your pharmacy*   Lab Work: Not needed    Testing/Procedures: Not needed   Follow-Up: At [facility], you and your health needs are our priority.  As part of our continuing mission to provide you with exceptional heart care, we have created designated Provider Care Teams.  These Care Teams include your primary Cardiologist (physician) and Advanced Practice Providers (APPs -  Physician Assistants and Nurse Practitioners) who all work together to provide you with the care you need, when you need it.  We recommend signing up for the patient portal called "MyChart".  Sign up information is provided on this After Visit Summary.  MyChart is used to connect with patients for Virtual Visits (Telemedicine).  Patients are able to view lab/test results, encounter notes, upcoming appointments, etc.  Non-urgent messages can be sent to your provider as well.   To learn more about what you can do with MyChart, go to [email or web address].    Your next appointment:   12 month(s)  The format for your next appointment:   In Person  Provider:   Bryan Lemma, MD       Important Information About Sugar           Bryan Lemma, M.D., M.S. Interventional Cardiologist   Pager # [phone] Phone # [phone] [address] [place], [place] [address]   Thank you for choosing Heartcare at [facility]!!

## 2021-09-30 ENCOUNTER — Other Ambulatory Visit: Payer: Self-pay | Admitting: Cardiology

## 2021-10-31 ENCOUNTER — Encounter: Payer: Self-pay | Admitting: Cardiology

## 2021-10-31 NOTE — Assessment & Plan Note (Signed)
BP looks good on current dose of Toprol and ramipril.

## 2021-10-31 NOTE — Assessment & Plan Note (Signed)
As far as I can tell, no breakthrough episodes on current dose of Toprol.  No change.

## 2021-10-31 NOTE — Assessment & Plan Note (Signed)
LDL is well controlled at 45 on atorvastatin 80 mg.  Tolerating relatively well with no myalgias or arthralgias.  Could consider reducing atorvastatin to 40 mg if he does have some myalgias.  For diabetes he is only on metformin.  A1c is 7.8.  Strongly consider SGLT2 inhibitor or GLP agonist both of which also have cardiovascular benefit.  Could use the diabetes dose of SGLT2 inhibitors  Defer to PCP

## 2021-10-31 NOTE — Assessment & Plan Note (Signed)
As noted, inadequate control.  Probably needs more than metformin.  We will try to consider SGLT2 inhibitor or GLP-1 agonist.  Will defer to PCP and which was a better option, but need to try to shoot for LDL less than 7 initially and then less than 6 finally.

## 2021-10-31 NOTE — Assessment & Plan Note (Signed)
Distant history of PCI to the LAD.  Maintains on maintenance dose of clopidogrel.  Is also on stable dose of beta-blocker ACE inhibitor statin.  Doing well.  No further angina.  Plan: Continue current meds.  Okay to hold Plavix 5 to 7 days preop for surgeries or procedures.

## 2021-10-31 NOTE — Assessment & Plan Note (Signed)
No further episodes.  Just occasional flip-flopping palpitations every now and then.  Much better with having switched to Toprol to nighttime.

## 2021-12-30 ENCOUNTER — Other Ambulatory Visit: Payer: Self-pay | Admitting: Cardiology

## 2022-02-10 ENCOUNTER — Other Ambulatory Visit: Payer: Self-pay | Admitting: Cardiology

## 2022-10-02 ENCOUNTER — Other Ambulatory Visit: Payer: Self-pay | Admitting: Cardiology

## 2022-10-03 ENCOUNTER — Other Ambulatory Visit: Payer: Self-pay | Admitting: Cardiology

## 2022-11-03 ENCOUNTER — Other Ambulatory Visit: Payer: Self-pay | Admitting: Cardiology

## 2022-11-08 NOTE — Progress Notes (Deleted)
Office Visit    Patient Name: [name] Date of Encounter: 11/08/2022  Primary Care Provider:  [facility] Primary Cardiologist:  Bryan Lemma, MD  Chief Complaint  72 year old male with past medical history of CAD with PCI to the LAD in 2005, history of syncope, PSVT, hypertension and hyperlipidemia.  He is seen today for follow-up regarding his CAD.  Past Medical History    Past Medical History:  Diagnosis Date   Arthritis    CAD S/P percutaneous coronary angioplasty 05/03/2003   s/p PCI of LAD 3.0x37mm Promus DES; ReCath in 07/2010 & 12/2012: Widely patent LAD stent with minimal disease elsewhere, Normal EF ; negative Myoview in January 2014   Diabetes mellitus without complication (HCC)    type 2   Dyslipidemia    Essential hypertension    GERD (gastroesophageal reflux disease)    History of PSVT (paroxysmal supraventricular tachycardia)    History of syncope 05/02/2012   s/p Loop Recorder - no positive etiology noted.   Right clavicle fracture    Seizures (HCC)    in teenage year's   Past Surgical History:  Procedure Laterality Date   CARDIAC CATHETERIZATION  2005   noncritical CAD, aneursym in prox LAD, normal LV systolic function   CARDIAC CATHETERIZATION  2/28//2012   3.0x10mm Promus DES to LAD (repeat cath 07/2010 by Dr. Julieanne Manson showed patency of stent)   CARDIAC CATHETERIZATION  12/2012   widely patent LAD stent; minimal CAD elsewhere; patulous LAD   COLONOSCOPY W/ BIOPSIES AND POLYPECTOMY     HERNIA REPAIR  2005   LEFT HEART CATHETERIZATION WITH CORONARY ANGIOGRAM N/A 01/24/2013   Marykay Lex, MD;  Location: [facility] CATH LAB;  Service: Cardiovascular;: Widely patent LAD stent, minmal CAD elsewhere, normal EF   LOOP RECORDER EXPLANT N/A 03/25/2013   Procedure: LOOP RECORDER EXPLANT;  Surgeon: Thurmon Fair, MD;  Location: [facility] CATH LAB;  Service: Cardiovascular;  Laterality: N/A;   LOOP RECORDER IMPLANT  05/2012   Medtronic Reveal XT -  placed for near-syncopal episodes by Dr. Judie Petit. Croitoru   LOOP RECORDER IMPLANT N/A 05/11/2012   Procedure: LOOP RECORDER IMPLANT;  Surgeon: Thurmon Fair, MD;  Location: [facility] CATH LAB;  Service: Cardiovascular;  Laterality: N/A;   NM MYOVIEW LTD  05/2012   Normal Stress Test - No ischemia or Infarction, EF 62%   ORIF ANKLE FRACTURE Left 01/15/2021   Procedure: OPEN REDUCTION INTERNAL FIXATION (ORIF) ANKLE FRACTURE;  Surgeon: Gwyneth Revels, DPM;  Location: [facility] ORS;  Service: Podiatry;  Laterality: Left;   ORIF CLAVICULAR FRACTURE Right 01/10/2014   Procedure: OPEN REDUCTION INTERNAL FIXATION (ORIF) RIGHT  CLAVICULAR FRACTURE;  Surgeon: Verlee Rossetti, MD;  Location: [facility] OR;  Service: Orthopedics;  Laterality: Right;   TRANSTHORACIC ECHOCARDIOGRAM  05/2012   EF 55-60%, normal wall motion, no valvular abnormalities - ordered for syncope   Allergies  No Known Allergies Labs/Other Studies Reviewed    The following studies were reviewed today:    Recent Labs: No results found for requested labs within last 365 days.  Recent Lipid Panel    Component Value Date/Time   CHOL 98 (L) 12/19/2018 0825   TRIG 83 12/19/2018 0825   HDL 37 (L) 12/19/2018 0825   CHOLHDL 2.6 12/19/2018 0825   CHOLHDL 2.6 01/24/2013 0705   VLDL 12 01/24/2013 0705   LDLCALC 44 12/19/2018 0825   History of Present Illness  73 year old male with past medical history of CAD with PCI/DES to the LAD  in 2005, history of syncope, PSVT, hypertension and hyperlipidemia.  Most recent heart catheterization was in 2014 showing patent stents and echo in 2014 indicated an EF of 55 to 60%.  In 2014 he had a syncopal event, a loop recorder placed that has since been explanted.  Last seen by Dr. Herbie Baltimore on 09/21/2021, that he had been doing fairly well.  Noted some dyspnea when going uphill fast, denied chest pain or pressure.  He reported off-and-on palpitations that would last a few seconds did not feel like his history of prolonged  PSVT.  CAD: History of PCI/DES to LAD in 2005.  Angina? Continue: HTN:  Blood pressure today   Syncope/History of PSVT: Reported to have near syncope in 2014, he had loop recorder implanted that indicated PSVT, loop was removed.  Futher events? Hyperlipidemia:   Home Medications    Current Outpatient Medications  Medication Sig Dispense Refill   atorvastatin (LIPITOR) 80 MG tablet TAKE 1 TABLET BY MOUTH EVERY DAY 90 tablet 1   clopidogrel (PLAVIX) 75 MG tablet TAKE 1 TABLET BY MOUTH EVERY MORNING. 90 tablet 3   Cyanocobalamin (QC VITAMIN B12 PO) Take 1 tablet by mouth daily.     IRON PO Take 1 tablet by mouth 3 (three) times a week.     metFORMIN (GLUCOPHAGE-XR) 500 MG 24 hr tablet Take 500 mg by mouth 2 (two) times daily.     metoprolol succinate (TOPROL-XL) 25 MG 24 hr tablet TAKE 1 TABLET BY MOUTH EVERY DAY 90 tablet 1   nitroGLYCERIN (NITROSTAT) 0.4 MG SL tablet Place 1 tablet (0.4 mg total) under the tongue every 5 (five) minutes as needed. For chest pain 25 tablet 6   oxyCODONE-acetaminophen (PERCOCET) 5-325 MG tablet Take 1 tablet by mouth every 4 (four) hours as needed for severe pain. 20 tablet 0   oxyCODONE-acetaminophen (PERCOCET) 5-325 MG tablet Take 1-2 tablets by mouth every 6 (six) hours as needed for severe pain. Max 6 tabs per day 30 tablet 0   pantoprazole (PROTONIX) 40 MG tablet Take 40 mg by mouth daily.     ramipril (ALTACE) 5 MG capsule TAKE 1 CAPSULE BY MOUTH EVERY DAY 90 capsule 2   VITAMIN D, CHOLECALCIFEROL, PO Take 1 tablet by mouth daily.     No current facility-administered medications for this visit.     Review of Systems    ***.  All other systems reviewed and are otherwise negative except as noted above.    Physical Exam    VS:  There were no vitals taken for this visit. , BMI There is no height or weight on file to calculate BMI.     GEN: Well nourished, well developed, in no acute distress. HEENT: normal. Neck: Supple, no JVD, carotid  bruits, or masses. Cardiac: RRR, no murmurs, rubs, or gallops. No clubbing, cyanosis, edema.  Radials/DP/PT 2+ and equal bilaterally.  Respiratory:  Respirations regular and unlabored, clear to auscultation bilaterally. GI: Soft, nontender, nondistended, BS + x 4. MS: no deformity or atrophy. Skin: warm and dry, no rash. Neuro:  Strength and sensation are intact. Psych: Normal affect.  Accessory Clinical Findings    ECG personally reviewed by me today -    - no acute changes.   Lab Results  Component Value Date   WBC 7.5 01/14/2021   HGB 13.7 01/14/2021   HCT 38.7 (L) 01/14/2021   MCV 87.2 01/14/2021   PLT 208 01/14/2021   Lab Results  Component Value Date   CREATININE  0.96 01/14/2021   BUN 22 01/14/2021   NA 135 01/14/2021   K 4.2 01/14/2021   CL 102 01/14/2021   CO2 25 01/14/2021   Lab Results  Component Value Date   ALT 33 07/11/2017   AST 18 07/11/2017   ALKPHOS 74 07/11/2017   BILITOT 0.6 07/11/2017   Lab Results  Component Value Date   CHOL 98 (L) 12/19/2018   HDL 37 (L) 12/19/2018   LDLCALC 44 12/19/2018   TRIG 83 12/19/2018   CHOLHDL 2.6 12/19/2018    Lab Results  Component Value Date   HGBA1C 7.8 (H) 07/11/2017    Assessment & Plan    1.  ***  No BP recorded.  {Refresh Note OR Click here to enter BP  :1}***   Rip Harbour, NP 11/08/2022, 8:21 PM

## 2022-11-10 ENCOUNTER — Ambulatory Visit: Payer: Medicare Other | Attending: General Practice | Admitting: General Practice

## 2022-11-10 ENCOUNTER — Other Ambulatory Visit: Payer: Self-pay | Admitting: Cardiology

## 2022-11-10 DIAGNOSIS — I2510 Atherosclerotic heart disease of native coronary artery without angina pectoris: Secondary | ICD-10-CM

## 2022-11-10 DIAGNOSIS — I10 Essential (primary) hypertension: Secondary | ICD-10-CM

## 2022-11-10 DIAGNOSIS — R55 Syncope and collapse: Secondary | ICD-10-CM

## 2022-11-10 DIAGNOSIS — E1169 Type 2 diabetes mellitus with other specified complication: Secondary | ICD-10-CM

## 2022-11-10 DIAGNOSIS — Z8679 Personal history of other diseases of the circulatory system: Secondary | ICD-10-CM

## 2022-12-17 IMAGING — RF DG C-ARM 1-60 MIN
1 series · 4 of 4 positions shown · non-contrast
Comparison: 01/11/2021

CLINICAL DATA: Ankle ORIF

EXAM:
LEFT ANKLE - 2 VIEW; DG C-ARM 1-60 MIN

[Series 1: dg x-ray · 0.20mm/px · 4 of 4 slices shown]
[im 1/4]
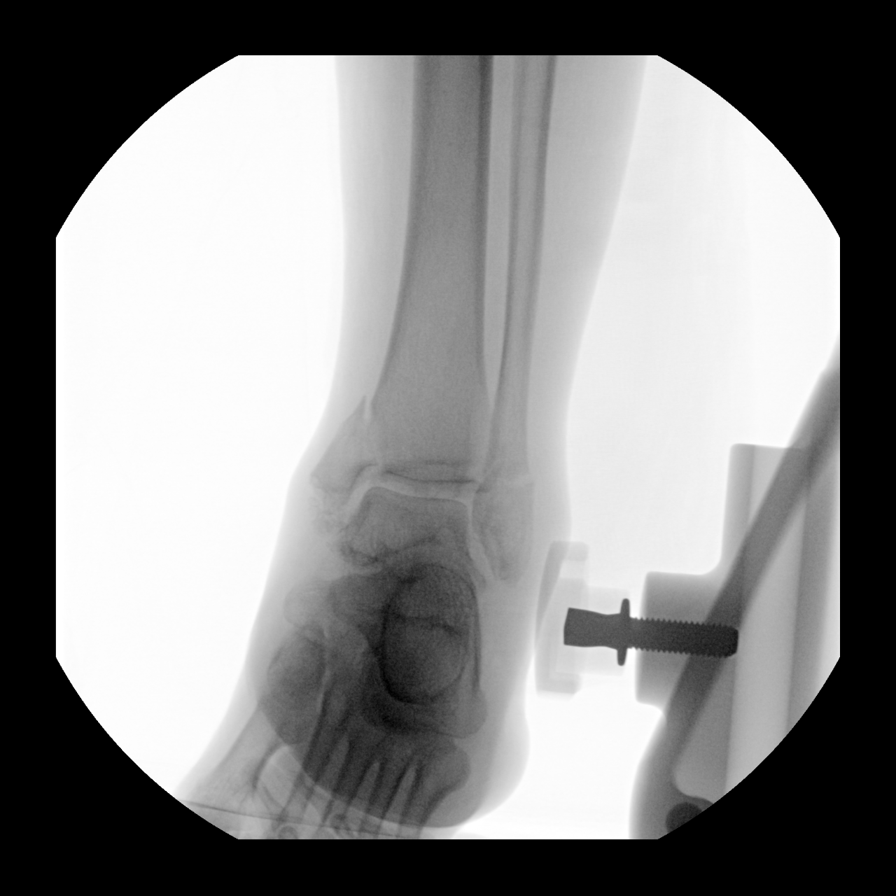
[im 2/4]
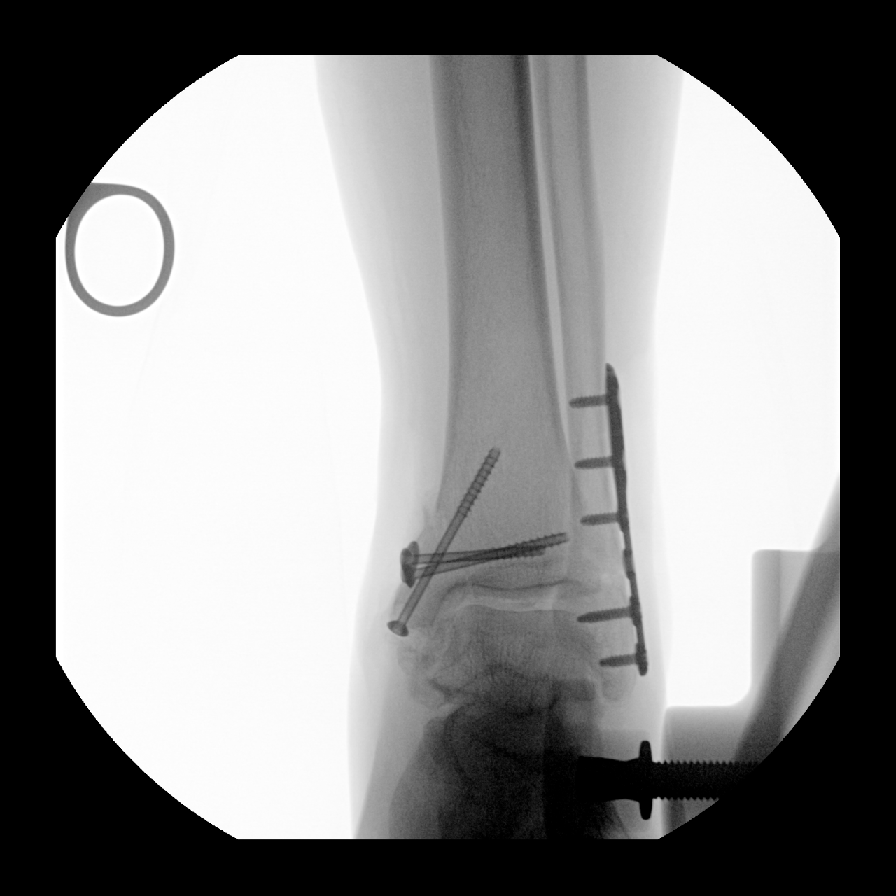
[im 3/4]
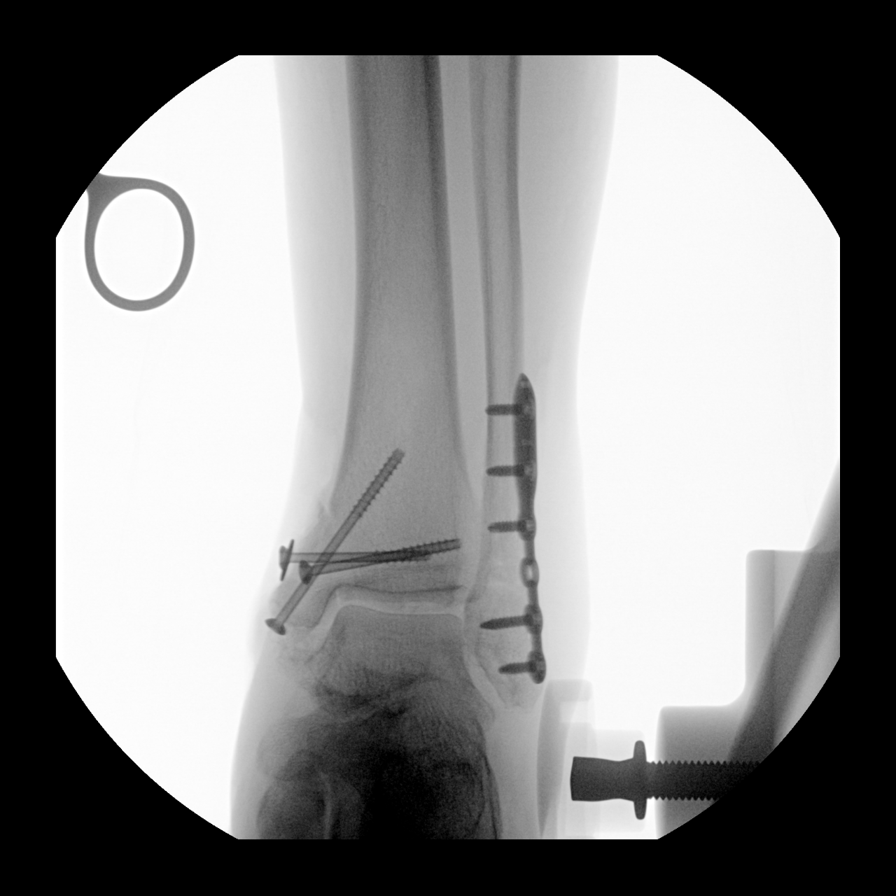
[im 4/4]
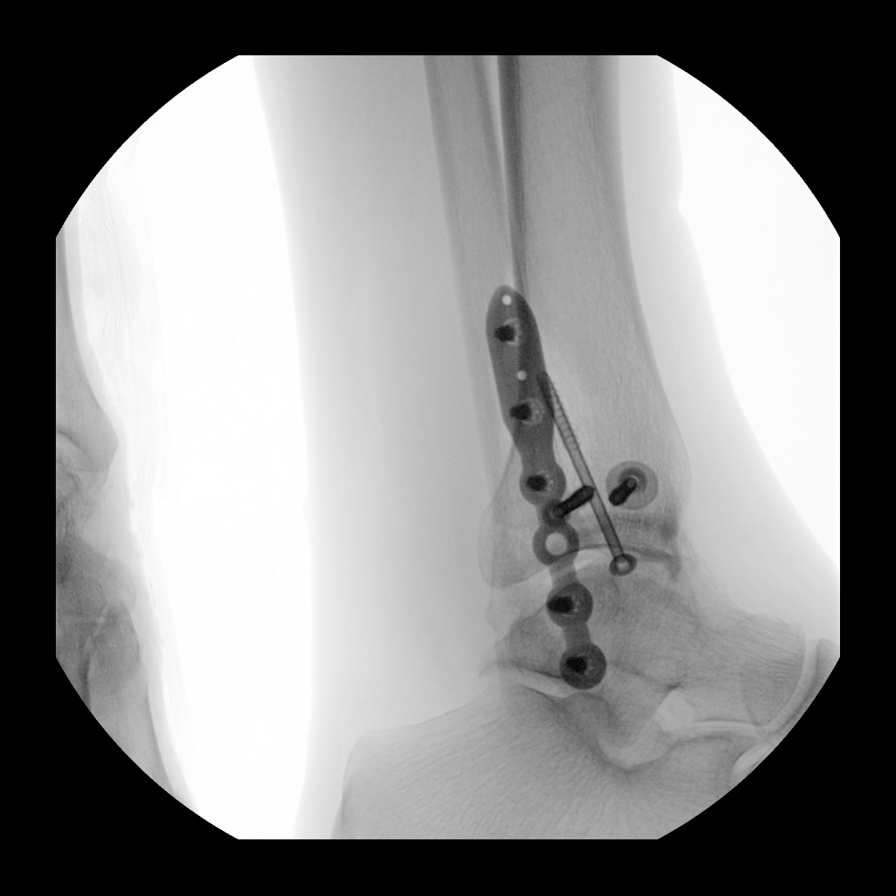

[4 of 4 positions shown; findings below may reference images not displayed]

FINDINGS: Four low resolution intraoperative spot views of the left ankle.
Total fluoroscopy time was 33 seconds. The images demonstrate
surgical plate and screw fixation of the distal fibula and multiple
screw fixation of the distal tibia for bimalleolar fracture.
Anatomic alignment.
IMPRESSION: Intraoperative fluoroscopic assistance provided during surgical
fixation of ankle fractures.

## 2023-01-06 ENCOUNTER — Other Ambulatory Visit: Payer: Self-pay | Admitting: Cardiology

## 2023-01-17 ENCOUNTER — Ambulatory Visit (INDEPENDENT_AMBULATORY_CARE_PROVIDER_SITE_OTHER): Payer: Medicare Other | Admitting: Orthopedic Surgery

## 2023-01-17 ENCOUNTER — Encounter: Payer: Self-pay | Admitting: Orthopedic Surgery

## 2023-01-17 ENCOUNTER — Other Ambulatory Visit (INDEPENDENT_AMBULATORY_CARE_PROVIDER_SITE_OTHER): Payer: Medicare Other

## 2023-01-17 VITALS — BP 115/71 | HR 91 | Ht 68.0 in | Wt 175.0 lb

## 2023-01-17 DIAGNOSIS — M1120 Other chondrocalcinosis, unspecified site: Secondary | ICD-10-CM | POA: Diagnosis not present

## 2023-01-17 DIAGNOSIS — M1712 Unilateral primary osteoarthritis, left knee: Secondary | ICD-10-CM | POA: Diagnosis not present

## 2023-01-17 DIAGNOSIS — M25562 Pain in left knee: Secondary | ICD-10-CM | POA: Diagnosis not present

## 2023-01-17 DIAGNOSIS — G8929 Other chronic pain: Secondary | ICD-10-CM

## 2023-01-17 NOTE — Progress Notes (Signed)
New Patient Visit  Assessment: [name] is a 72 y.o. male with the following: 1. Arthritis of left knee 2. Chondrocalcinosis articularis  Plan: Earney Hamburg has acute on chronic pain in the left knee.  Pain is primarily in the medial aspect of the knee.  X-rays obtained in clinic today demonstrates moderate degenerative changes, with chondrocalcinosis.  This is likely contributing to his current complaints.  He should be careful continue to take NSAIDs, due to chronic use of Plavix.  I offered him a steroid injection, and he elected to proceed.  If he has any further issues, he will contact the clinic.  Procedure note injection Left knee joint   Verbal consent was obtained to inject the left knee joint  Timeout was completed to confirm the site of injection.  The skin was prepped with alcohol and ethyl chloride was sprayed at the injection site.  A 21-gauge needle was used to inject 40 mg of Depo-Medrol and 1% lidocaine (4 cc) into the left knee using an anterolateral approach.  There were no complications. A sterile bandage was applied.     Follow-up: Return if symptoms worsen or fail to improve.  Subjective:  Chief Complaint  Patient presents with   Knee Pain    L knee pain for 3-4 mos. 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 male who presents for evaluation of left knee pain.  He has had progressively worsening pain in the medial aspect of the left knee for the past 3-4 months.  No specific injury.  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  Occasional swelling.  He states diclofenac has been helpful.  He does take Plavix.  No physical therapy.  He has never had an injection.   Review of Systems: No fevers or chills No numbness or  tingling No chest pain No shortness of breath No bowel or bladder dysfunction No GI distress No headaches   Medical History:  Past Medical History:  Diagnosis Date   Arthritis    CAD S/P percutaneous coronary angioplasty 05/03/2003   s/p PCI of LAD 3.0x45mm Promus DES; ReCath in 07/2010 & 12/2012: Widely patent LAD stent with minimal disease elsewhere, Normal EF ; negative Myoview in January 2014   Diabetes mellitus without complication (HCC)    type 2   Dyslipidemia    Essential hypertension    GERD (gastroesophageal reflux disease)    History of PSVT (paroxysmal supraventricular tachycardia)    History of syncope 05/02/2012   s/p Loop Recorder - no positive etiology noted.   Right clavicle fracture    Seizures (HCC)    in teenage year's    Past Surgical History:  Procedure Laterality Date   CARDIAC CATHETERIZATION  2005   noncritical CAD, aneursym in prox LAD, normal LV systolic function   CARDIAC CATHETERIZATION  2/28//2012   3.0x74mm Promus DES to LAD (repeat cath 07/2010 by Dr. Julieanne Manson showed patency of stent)   CARDIAC CATHETERIZATION  12/2012   widely patent LAD stent; minimal CAD elsewhere; patulous LAD   COLONOSCOPY W/ BIOPSIES AND POLYPECTOMY     HERNIA REPAIR  2005   LEFT HEART CATHETERIZATION WITH CORONARY ANGIOGRAM N/A 01/24/2013   Onalee Hua  Starr Lake, MD;  Location: [facility] CATH LAB;  Service: Cardiovascular;: Widely patent LAD stent, minmal CAD elsewhere, normal EF   LOOP RECORDER EXPLANT N/A 03/25/2013   Procedure: LOOP RECORDER EXPLANT;  Surgeon: Thurmon Fair, MD;  Location: [facility] CATH LAB;  Service: Cardiovascular;  Laterality: N/A;   LOOP RECORDER IMPLANT  05/2012   Medtronic Reveal XT - placed for near-syncopal episodes by Dr. Judie Petit. Croitoru   LOOP RECORDER IMPLANT N/A 05/11/2012   Procedure: LOOP RECORDER IMPLANT;  Surgeon: Thurmon Fair, MD;  Location: [facility] CATH LAB;  Service: Cardiovascular;  Laterality: N/A;   NM MYOVIEW LTD  05/2012   Normal Stress Test - No  ischemia or Infarction, EF 62%   ORIF ANKLE FRACTURE Left 01/15/2021   Procedure: OPEN REDUCTION INTERNAL FIXATION (ORIF) ANKLE FRACTURE;  Surgeon: Gwyneth Revels, DPM;  Location: [facility] ORS;  Service: Podiatry;  Laterality: Left;   ORIF CLAVICULAR FRACTURE Right 01/10/2014   Procedure: OPEN REDUCTION INTERNAL FIXATION (ORIF) RIGHT  CLAVICULAR FRACTURE;  Surgeon: Verlee Rossetti, MD;  Location: [facility] OR;  Service: Orthopedics;  Laterality: Right;   TRANSTHORACIC ECHOCARDIOGRAM  05/2012   EF 55-60%, normal wall motion, no valvular abnormalities - ordered for syncope    Family History  Problem Relation Age of Onset   Heart disease Mother    Hypertension Mother    Heart attack Father    Cancer Maternal Grandfather    Leukemia Maternal Grandfather    Stroke Paternal Grandmother    Heart attack Brother    Social History   Tobacco Use   Smoking status: Never   Smokeless tobacco: Never  Substance Use Topics   Alcohol use: No   Drug use: No    No Known Allergies  Current Meds  Medication Sig   atorvastatin (LIPITOR) 80 MG tablet TAKE 1 TABLET BY MOUTH EVERY DAY   clopidogrel (PLAVIX) 75 MG tablet TAKE 1 TABLET BY MOUTH EVERY DAY IN THE MORNING   Cyanocobalamin (QC VITAMIN B12 PO) Take 1 tablet by mouth daily.   IRON PO Take 1 tablet by mouth 3 (three) times a week.   metFORMIN (GLUCOPHAGE-XR) 500 MG 24 hr tablet Take 500 mg by mouth 2 (two) times daily.   metoprolol succinate (TOPROL-XL) 25 MG 24 hr tablet TAKE 1 TABLET BY MOUTH EVERY DAY   nitroGLYCERIN (NITROSTAT) 0.4 MG SL tablet Place 1 tablet (0.4 mg total) under the tongue every 5 (five) minutes as needed. For chest pain   oxyCODONE-acetaminophen (PERCOCET) 5-325 MG tablet Take 1 tablet by mouth every 4 (four) hours as needed for severe pain.   oxyCODONE-acetaminophen (PERCOCET) 5-325 MG tablet Take 1-2 tablets by mouth every 6 (six) hours as needed for severe pain. Max 6 tabs per day   pantoprazole (PROTONIX) 40 MG tablet Take 40 mg  by mouth daily.   ramipril (ALTACE) 5 MG capsule TAKE 1 CAPSULE BY MOUTH EVERY DAY   VITAMIN D, CHOLECALCIFEROL, PO Take 1 tablet by mouth daily.    Objective: BP 115/71   Pulse 91   Ht 5\' 8"  (1.727 m)   Wt 175 lb (79.4 kg)   BMI 26.61 kg/m   Physical Exam:  General: Alert and oriented. and No acute distress. Gait: Left sided antalgic gait.  Evaluation of left knee demonstrates no deformity.  Mild varus alignment overall.  No swelling.  Tenderness to palpation along the medial joint line.  He has good range of motion.  No increased laxity varus or valgus stress.  Negative Lachman.  IMAGING:  I personally ordered and reviewed the following images  X-rays of the left knee were obtained in clinic today.  No acute injuries are noted.  Mild varus alignment.  Decreased joint space within the medial compartment.  There is chondrocalcinosis of both the medial and lateral compartment.  Mild loss of joint space within the patellofemoral compartment, without osteophytes of both the medial and lateral facet.  Small osteophyte off the medial trochlea.  Impression: Left knee x-ray with moderate degenerative changes and chondrocalcinosis   New Medications:  No orders of the defined types were placed in this encounter.     Oliver Barre, MD  01/17/2023 12:03 PM

## 2023-01-17 NOTE — Patient Instructions (Signed)

## 2023-01-18 ENCOUNTER — Other Ambulatory Visit: Payer: Self-pay | Admitting: Cardiology

## 2023-01-28 ENCOUNTER — Other Ambulatory Visit: Payer: Self-pay | Admitting: Cardiology

## 2023-02-01 ENCOUNTER — Other Ambulatory Visit: Payer: Self-pay | Admitting: Cardiology

## 2023-02-23 ENCOUNTER — Other Ambulatory Visit: Payer: Self-pay | Admitting: Cardiology

## 2023-02-24 ENCOUNTER — Encounter: Payer: Self-pay | Admitting: Adult Health

## 2023-02-24 ENCOUNTER — Ambulatory Visit: Payer: Medicare Other | Attending: Adult Health | Admitting: Adult Health

## 2023-02-24 VITALS — BP 110/70 | HR 84 | Resp 16 | Ht 68.0 in | Wt 174.8 lb

## 2023-02-24 DIAGNOSIS — I2510 Atherosclerotic heart disease of native coronary artery without angina pectoris: Secondary | ICD-10-CM | POA: Insufficient documentation

## 2023-02-24 DIAGNOSIS — Z9861 Coronary angioplasty status: Secondary | ICD-10-CM | POA: Insufficient documentation

## 2023-02-24 DIAGNOSIS — I10 Essential (primary) hypertension: Secondary | ICD-10-CM | POA: Diagnosis not present

## 2023-02-24 DIAGNOSIS — E7800 Pure hypercholesterolemia, unspecified: Secondary | ICD-10-CM | POA: Diagnosis not present

## 2023-02-24 MED ORDER — METOPROLOL SUCCINATE ER 25 MG PO TB24: 25.0000 mg | ORAL_TABLET | Freq: Every day | ORAL | 3 refills | Status: DC

## 2023-02-24 MED ORDER — CLOPIDOGREL BISULFATE 75 MG PO TABS: 75.0000 mg | ORAL_TABLET | Freq: Once | ORAL | 3 refills | Status: AC

## 2023-02-24 MED ORDER — RAMIPRIL 5 MG PO CAPS: ORAL_CAPSULE | ORAL | 3 refills | Status: DC

## 2023-02-24 MED ORDER — ATORVASTATIN CALCIUM 80 MG PO TABS: 80.0000 mg | ORAL_TABLET | Freq: Every day | ORAL | 3 refills | Status: DC

## 2023-02-24 NOTE — Patient Instructions (Signed)
Medication Instructions:  No Changes *If you need a refill on your cardiac medications before your next appointment, please call your pharmacy*   Lab Work: No Labs If you have labs (blood work) drawn today and your tests are completely normal, you will receive your results only by: [place] (if you have MyChart) OR A paper copy in the mail If you have any lab test that is abnormal or we need to change your treatment, we will call you to review the results.   Testing/Procedures: No Testing   Follow-Up: At [facility], you and your health needs are our priority.  As part of our continuing mission to provide you with exceptional heart care, we have created designated Provider Care Teams.  These Care Teams include your primary Cardiologist (physician) and Advanced Practice Providers (APPs -  Physician Assistants and Nurse Practitioners) who all work together to provide you with the care you need, when you need it.  We recommend signing up for the patient portal called "MyChart".  Sign up information is provided on this After Visit Summary.  MyChart is used to connect with patients for Virtual Visits (Telemedicine).  Patients are able to view lab/test results, encounter notes, upcoming appointments, etc.  Non-urgent messages can be sent to your provider as well.   To learn more about what you can do with MyChart, go to [email or web address].    Your next appointment:   1 year(s)  Provider:   Glenetta Hew, MD

## 2023-02-24 NOTE — Progress Notes (Signed)
  Cardiology Office Note:  .   Date:  02/24/2023  ID:  Earney Hamburg, DOB 08-20-1950, MRN [id number] PCP: [facility] Providers Cardiologist:  Dr. Herbie Baltimore  }   History of Present Illness: .   [name] is a 72 y.o. male history of coronary artery disease status post PCI to the LAD (distant), hypertension, hyperlipidemia associated with type 2 diabetes, prior history of near syncope in 2014 with ILR in situ, symptoms improved with switching metoprolol to nighttime.  Last seen by Dr. Herbie Baltimore on 09/21/2021.  He comes today without any cardiac complaint.  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  He is without chest pain, dyspnea on exertion, profound fatigue, tachypalpitations, or dizziness.  He is medically compliant.  He is here for annual follow-up and medication refills.  ROS: As above otherwise negative.  Studies Reviewed: Marland Kitchen   EKG Interpretation Date/Time:  Friday February 24 2023 15:37:28 EDT Ventricular Rate:  84 PR Interval:  172 QRS Duration:  110 QT Interval:  382 QTC Calculation: 451 R Axis:   -61  Text Interpretation: Normal sinus rhythm Left anterior fascicular block Left ventricular hypertrophy with repolarization abnormality ( R in aVL , Cornell product ) Cannot rule out Septal infarct , age undetermined When compared with ECG of 14-Jan-2021 12:13, QRS duration has increased Confirmed by Joni Reining [phone]) on 02/24/2023 3:52:54 PM      Physical Exam:   VS:  BP 110/70 (BP Location: Left Arm, Patient Position: Sitting, Cuff Size: Normal)   Pulse 84   Resp 16   Ht 5\' 8"  (1.727 m)   Wt 174 lb 12.8 oz (79.3 kg)   SpO2 95%   BMI 26.58 kg/m    Wt Readings from Last 3 Encounters:  02/24/23 174 lb 12.8 oz (79.3 kg)  01/17/23 175 lb (79.4 kg)  09/21/21 172 lb 3.2 oz (78.1 kg)    GEN: Well nourished, well developed in no acute  distress NECK: No JVD; No carotid bruits CARDIAC: RRR, no murmurs, rubs, gallops RESPIRATORY:  Clear to auscultation without rales, wheezing or rhonchi  ABDOMEN: Soft, non-tender, non-distended EXTREMITIES:  No edema; No deformity   ASSESSMENT AND PLAN: .    Coronary artery disease: Remote history of PCI to the LAD (unknown date).  He is completely asymptomatic.  Very active on his farm.  Continue Plavix 75 mg daily as directed.  Continue secondary management with blood pressure control, lipid management, purposeful exercise.  No changes in his medication regimen.  2.  Hypertension: Blood pressure is very well-controlled on current medication regimen.  He is on ramipril 5 mg daily and metoprolol 25 mg daily.  He is tolerating his blood pressure well without symptoms of dizziness or fatigue.  No changes in his regimen.  3.  Hyperlipidemia: Remains on statin therapy.  Labs are followed by PCP.  Continue atorvastatin 80 mg daily.  Goal of LDL less than 70.      Signed, Bettey Mare. Liborio Nixon, ANP, AACC

## 2024-02-26 ENCOUNTER — Other Ambulatory Visit: Payer: Self-pay | Admitting: Adult Health

## 2024-02-28 ENCOUNTER — Telehealth: Payer: Self-pay | Admitting: Adult Health

## 2024-02-28 MED ORDER — METOPROLOL SUCCINATE ER 25 MG PO TB24: 25.0000 mg | ORAL_TABLET | Freq: Every day | ORAL | 0 refills | Status: DC

## 2024-02-28 NOTE — Telephone Encounter (Signed)
 Pt scheduled to see Dr. Anner 05/18/23, 90 day refill for Metoprolol  has been sent to [facility].

## 2024-02-28 NOTE — Telephone Encounter (Signed)
*  STAT* If patient is at the pharmacy, call can be transferred to refill team.   1. Which medications need to be refilled? (please list name of each medication and dose if known) metoprolol  succinate (TOPROL -XL) 25 MG 24 hr tablet    2. Would you like to learn more about the convenience, safety, & potential cost savings by using the [facility]? No    3. Are you open to using the [facility] (Type [facility]. No   4. Which pharmacy/location (including street and city if local pharmacy) is medication to be sent to?[facility] - [place], [place] - [address]    5. Do they need a 30 day or 90 day supply? 90 day   Pt has appt scheduled 05/17/24 Pt is out of medication

## 2024-05-17 ENCOUNTER — Encounter: Payer: Self-pay | Admitting: Cardiology

## 2024-05-17 ENCOUNTER — Ambulatory Visit: Attending: Cardiology | Admitting: Cardiology

## 2024-05-17 VITALS — BP 124/50 | HR 71 | Ht 68.0 in | Wt 169.4 lb

## 2024-05-17 DIAGNOSIS — E785 Hyperlipidemia, unspecified: Secondary | ICD-10-CM

## 2024-05-17 DIAGNOSIS — Z136 Encounter for screening for cardiovascular disorders: Secondary | ICD-10-CM

## 2024-05-17 DIAGNOSIS — I2510 Atherosclerotic heart disease of native coronary artery without angina pectoris: Secondary | ICD-10-CM | POA: Diagnosis not present

## 2024-05-17 DIAGNOSIS — I10 Essential (primary) hypertension: Secondary | ICD-10-CM

## 2024-05-17 DIAGNOSIS — Z8679 Personal history of other diseases of the circulatory system: Secondary | ICD-10-CM | POA: Diagnosis not present

## 2024-05-17 DIAGNOSIS — E1169 Type 2 diabetes mellitus with other specified complication: Secondary | ICD-10-CM | POA: Diagnosis not present

## 2024-05-17 DIAGNOSIS — Z9861 Coronary angioplasty status: Secondary | ICD-10-CM | POA: Diagnosis not present

## 2024-05-17 MED ORDER — NITROGLYCERIN 0.4 MG SL SUBL: 0.4000 mg | SUBLINGUAL_TABLET | SUBLINGUAL | 6 refills | Status: AC | PRN

## 2024-05-17 MED ORDER — METOPROLOL SUCCINATE ER 25 MG PO TB24: 25.0000 mg | ORAL_TABLET | Freq: Every day | ORAL | 0 refills | Status: AC

## 2024-05-17 MED ORDER — RAMIPRIL 5 MG PO CAPS: 5.0000 mg | ORAL_CAPSULE | Freq: Every day | ORAL | 3 refills | Status: AC

## 2024-05-17 MED ORDER — ATORVASTATIN CALCIUM 80 MG PO TABS: 80.0000 mg | ORAL_TABLET | Freq: Every day | ORAL | 3 refills | Status: AC

## 2024-05-17 NOTE — Progress Notes (Unsigned)
 " Cardiology Office Note:  .   Date:  05/20/2024  ID:  [name], DOB 01-Nov-1950, MRN [id number] PCP: [facility], [facility]   HeartCare Providers Cardiologist:  Alm Clay, MD     Chief Complaint  Patient presents with   Follow-up    Delayed Annual f/u   Coronary Artery Disease    No Angina or CHF    Patient Profile: .     [name] is a 74 y.o. male with a PMH notable for CAD with CRF's of HTN, HLD and DM-2 who presents here for delayed annual follow-up at the request of [facility], [facility].  PMH: CAD-LAD PCI (06/2010) Widely patent stents in 12/2012 HTN HLD DM-2 ~PSVT  I last saw [name] in May 2023.  He was doing well with no major issues.  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  No major issues.  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  Lots of walking on the farm up and down hills.  Denied any chest pain pressure or dyspnea at rest or exertion.  No notable fatigue, just not able to do what he was able to do 10 years ago.  No rapid irregular heartbeats or palpitations.  No syncope.  Was on Toprol  25 mg daily, ramipril  5 [place] daily, atorvastatin  80 mg daily and Plavix .  Subjective  Discussed the use of AI scribe software for clinical note transcription with the patient, who gave verbal consent to proceed.  History of Present Illness [name] is a 74 year old male with coronary artery disease who presents for a follow-up visit.  He experiences jitteriness in the mornings, which he attributes to excessive caffeine or sugar intake. No episodes of heart racing, chest pain, pressure, tightness, or shortness of breath. No syncope, peripheral edema, or bleeding from the gastrointestinal or  urinary tract.  His coronary artery disease history includes a stent placement 13 years ago, with subsequent catheterizations performed since then. His last stress test and echocardiogram were performed in 2014. He continues to take clopidogrel  and atorvastatin  for his condition.  He is currently taking clopidogrel , atorvastatin  80 mg, metoprolol  succinate 25 mg at night, ramipril  5 mg in the morning, metformin 500 mg twice a day, Jardiance 10 mg, and Protonix . He stopped taking Dexilant a year ago and is no longer on pravastatin . Metformin causes gastrointestinal discomfort.  His A1c was previously 7.6% but has decreased to 6.7% after starting Jardiance. He has a history of diabetes management challenges and dislikes metformin due to gastrointestinal side effects.  He has a history of an ankle injury with hardware placement two years ago, causing discomfort, especially in cold weather. He experiences stiffness and pain after prolonged activity, which he attributes to arthritis and the presence of screws in his ankle.  He has a family history of stroke, as his mother had a stroke related to carotid artery issues. He mentions receiving frequent mail about screening tests but has not had any recent carotid or aortic screenings.   ROS:  Review of Systems - Negative except L ankle pain    Objective   Active Medications[1]   Studies Reviewed: [name]   EKG Interpretation Date/Time:  Friday May 17 2024 08:59:54 EST Ventricular Rate:  71 PR Interval:  178 QRS Duration:  108 QT Interval:  402 QTC Calculation: 436 R Axis:   -57  Text Interpretation: Normal sinus rhythm Left anterior fascicular block Moderate voltage criteria for LVH, may be normal variant ( R in aVL , Cornell product ) When compared with ECG of 24-Feb-2023 15:37, No significant change was found Confirmed by Anner Lenis ([id number]) on 05/17/2024 9:05:18 AM     CV Procedure History:  Procedure Laterality Date   CARDIAC  CATHETERIZATION  2005   noncritical CAD, aneursym in prox LAD, normal LV systolic function   LEFT HEART CATHETERIZATION WITH coronary ANGIOGRAM - PCI  2/28//2012   3.0x3mm Promus DES to LAD (repeat cath 07/2010 by Dr. Sim Ly showed patency of stent)   LEFT HEART CATHETERIZATION WITH CORONARY ANGIOGRAM N/A 01/24/2013   Lenis LELON Anner, MD;  Location: [facility] CATH LAB;  Service: Cardiovascular;: Widely patent LAD stent, minmal CAD elsewhere, normal EF; widely patent LAD stent; minimal CAD elsewhere; patulous LAD   LOOP RECORDER EXPLANT N/A 03/25/2013   Procedure: LOOP RECORDER EXPLANT;  Surgeon: Jerel Balding, MD;  Location: [facility] CATH LAB;  Service: Cardiovascular;  Laterality: N/A;   LOOP RECORDER IMPLANT N/A 05/11/2012   Procedure: LOOP RECORDER IMPLANT;  Surgeon: Jerel Balding, MD;  Location: [facility] CATH LAB;  Service: Cardiovascular;  Laterality: N/A; Medtronic Reveal XT - placed for near-syncopal episodes by Dr. CHRISTELLA. Croitoru   NM MYOVIEW  LTD  05/2012   Normal Stress Test - No ischemia or Infarction, EF 62%   TRANSTHORACIC ECHOCARDIOGRAM  05/2012   EF 55-60%, normal wall motion, no valvular abnormalities - ordered for syncope   Risk Assessment/Calculations:            Physical Exam:   VS:  BP (!) 124/50   Pulse 71   Ht 5' 8 (1.727 m)   Wt 169 lb 6.4 oz (76.8 kg)   SpO2 97%   BMI 25.76 kg/m    Wt Readings from Last 3 Encounters:  05/17/24 169 lb 6.4 oz (76.8 kg)  02/24/23 174 lb 12.8 oz (79.3 kg)  01/17/23 175 lb (79.4 kg)     GEN: Healthy appearing well nourished, well groomed; in no acute distress; NECK: No JVD; No carotid bruits CARDIAC: Normal S1, S2; RRR, no murmurs, rubs, gallops RESPIRATORY:  Clear to auscultation without rales, wheezing or rhonchi ; nonlabored, good air movement. ABDOMEN: Soft, non-tender, non-distended EXTREMITIES:  No edema; No deformity ; antalgic gait favoring left ankle.     ASSESSMENT AND PLAN: .   CAD S/P percutaneous coronary angioplasty Coronary  artery disease with stent placement in February 2012.  Stents patent in September 2014. No recent angina or heart failure symptoms. Blood pressure and LDL cholesterol well controlled. No stress tests needed unless symptomatic. - Continue clopidogrel /Plavix  75 mg daily or stent maintenance.  Okay to hold Plavix  5 to 7 days preop for surgeries or procedures. Okay to hold Plavix  for up to 7 days for significant leading or bruising. - Continue atorvastatin  80 mg for cholesterol management. - Continue metoprolol  succinate 25 mg and ramipril  5 mg for blood pressure control. - Continue Jardiance 10 mg daily - Ordered screening carotid doppler and abdominal aortic ultrasound.  Essential hypertension Blood pressure well controlled with current medication regimen. - Continue metoprolol  succinate 25 mg and ramipril  5 mg daily for blood pressure control.  Hyperlipidemia associated with type 2 diabetes mellitus (HCC) Hyperlipidemia well controlled with atorvastatin . LDL cholesterol at  45 mg/dL. Type 2 diabetes with improved A1c from 7.6% to 6.7% on Jardiance.  10 mg daily  - Continue atorvastatin  80 mg and metformin XR 5 mg twice daily for cholesterol management. - Continue Jardiance 10 mg for diabetes management.  - Ensure cholesterol levels and A1c checked in February and results faxed to [facility]  History of PSVT (paroxysmal supraventricular tachycardia) No recent episodes of tachycardia. Possible jitteriness due to caffeine or sugar. - Continue current management and monitor for recurrence of symptoms: Toprol  25 mg daily  Screening for AAA (abdominal aortic aneurysm) Order vascular ultrasound for carotid and abdominal aortic Dopplers  Encounter for screening for stenosis of carotid artery Ordered vascular screening for carotid Dopplers and abdominal aortic Dopplers. ABIs also included.   Orders Placed This Encounter  Procedures   EKG 12-Lead   VAS US  VASCUSCREEN        Follow-Up:  Return in about 1 year (around 05/17/2025) for 1 Yr Follow-up, Routine follow up with me, [employer].      Signed, Alm MICAEL Clay, MD, MS: Alm Clay, M.D., M.S. Interventional Cardiologist  [facility] Pager # [phone]          [1]  Current Meds  Medication Sig   clopidogrel  (PLAVIX ) 75 MG tablet Take 75 mg by mouth daily.   Cyanocobalamin (QC VITAMIN B12 PO) Take 1 tablet by mouth daily.   IRON PO Take 1 tablet by mouth 3 (three) times a week.   JARDIANCE 10 MG TABS tablet Take 10 mg by mouth daily.   metFORMIN (GLUCOPHAGE-XR) 500 MG 24 hr tablet Take 500 mg by mouth 2 (two) times daily.   oxyCODONE -acetaminophen  (PERCOCET) 5-325 MG tablet Take 1-2 tablets by mouth every 6 (six) hours as needed for severe pain. Max 6 tabs per day   pantoprazole  (PROTONIX ) 40 MG tablet Take 40 mg by mouth daily.   VITAMIN D, CHOLECALCIFEROL, PO Take 1 tablet by mouth daily.   [DISCONTINUED] atorvastatin  (LIPITOR) 80 MG tablet Take 1 tablet (80 mg total) by mouth daily.   [DISCONTINUED] metoprolol  succinate (TOPROL -XL) 25 MG 24 hr tablet Take 1 tablet (25 mg total) by mouth daily.   [DISCONTINUED] nitroGLYCERIN  (NITROSTAT ) 0.4 MG SL tablet Place 1 tablet (0.4 mg total) under the tongue every 5 (five) minutes as needed. For chest pain   [DISCONTINUED] ramipril  (ALTACE ) 5 MG capsule TAKE 1 CAPSULE BY MOUTH EVERY DAY.   "

## 2024-05-17 NOTE — Patient Instructions (Addendum)
 Medication Instructions:  No chanes   *If you need a refill on your cardiac medications before your next appointment, please call your pharmacy*   Lab Work: Not needed   Testing/Procedures: .schedule to have  vascuscreening for Carotid  and abd  aorta  Your physician has requested that you have a carotid duplex. This test is an ultrasound of the carotid arteries in your neck. It looks at blood flow through these arteries that supply the brain with blood. Allow one hour for this exam. There are no restrictions or special instructions.  Follow-Up: At [facility], you and your health needs are our priority.  As part of our continuing mission to provide you with exceptional heart care, we have created designated Provider Care Teams.  These Care Teams include your primary Cardiologist (physician) and Advanced Practice Providers (APPs -  Physician Assistants and Nurse Practitioners) who all work together to provide you with the care you need, when you need it.   Your physician has requested that you have an abdominal aorta duplex. During this test, an ultrasound is used to evaluate the aorta. Allow 30 minutes for this exam. Do not eat after midnight the day before and avoid carbonated beverages.  Please note: We ask at that you not bring children with you during ultrasound (echo/ vascular) testing. Due to room size and safety concerns, children are not allowed in the ultrasound rooms during exams. Our front office staff cannot provide observation of children in our lobby area while testing is being conducted. An adult accompanying a patient to their appointment will only be allowed in the ultrasound room at the discretion of the ultrasound technician under special circumstances. We apologize for any inconvenience.   Your next appointment:   12 month(s)  The format for your next appointment:   In Person  Provider:   Alm Clay, MD

## 2024-05-20 ENCOUNTER — Encounter: Payer: Self-pay | Admitting: Cardiology

## 2024-05-20 DIAGNOSIS — Z136 Encounter for screening for cardiovascular disorders: Secondary | ICD-10-CM | POA: Insufficient documentation

## 2024-05-20 NOTE — Assessment & Plan Note (Signed)
 Order vascular ultrasound for carotid and abdominal aortic Dopplers

## 2024-05-20 NOTE — Assessment & Plan Note (Addendum)
 Hyperlipidemia well controlled with atorvastatin . LDL cholesterol at 45 mg/dL. Type 2 diabetes with improved A1c from 7.6% to 6.7% on Jardiance.  10 mg daily  - Continue atorvastatin  80 mg and metformin XR 5 mg twice daily for cholesterol management. - Continue Jardiance 10 mg for diabetes management.  - Ensure cholesterol levels and A1c checked in February and results faxed to [facility]

## 2024-05-20 NOTE — Assessment & Plan Note (Signed)
 Ordered vascular screening for carotid Dopplers and abdominal aortic Dopplers. ABIs also included.

## 2024-05-20 NOTE — Assessment & Plan Note (Signed)
 Blood pressure well controlled with current medication regimen. - Continue metoprolol  succinate 25 mg and ramipril  5 mg daily for blood pressure control.

## 2024-05-20 NOTE — Assessment & Plan Note (Signed)
 Coronary artery disease with stent placement in February 2012.  Stents patent in September 2014. No recent angina or heart failure symptoms. Blood pressure and LDL cholesterol well controlled. No stress tests needed unless symptomatic. - Continue clopidogrel /Plavix  75 mg daily or stent maintenance.  Okay to hold Plavix  5 to 7 days preop for surgeries or procedures. Okay to hold Plavix  for up to 7 days for significant leading or bruising. - Continue atorvastatin  80 mg for cholesterol management. - Continue metoprolol  succinate 25 mg and ramipril  5 mg for blood pressure control. - Continue Jardiance 10 mg daily - Ordered screening carotid doppler and abdominal aortic ultrasound.

## 2024-05-20 NOTE — Assessment & Plan Note (Signed)
 No recent episodes of tachycardia. Possible jitteriness due to caffeine or sugar. - Continue current management and monitor for recurrence of symptoms: Toprol  25 mg daily

## 2024-06-06 ENCOUNTER — Ambulatory Visit: Payer: Self-pay | Admitting: Cardiology

## 2024-06-06 ENCOUNTER — Ambulatory Visit (HOSPITAL_COMMUNITY)

## 2024-06-06 DIAGNOSIS — E1169 Type 2 diabetes mellitus with other specified complication: Secondary | ICD-10-CM

## 2024-06-06 DIAGNOSIS — I2510 Atherosclerotic heart disease of native coronary artery without angina pectoris: Secondary | ICD-10-CM

## 2024-06-06 DIAGNOSIS — Z8679 Personal history of other diseases of the circulatory system: Secondary | ICD-10-CM

## 2024-06-06 DIAGNOSIS — I10 Essential (primary) hypertension: Secondary | ICD-10-CM
